# Patient Record
Sex: Female | Born: 1970 | Race: White | Hispanic: No | Marital: Married | Smoking: Never smoker
Health system: Southern US, Community
[De-identification: ages and names within clinical notes are randomized; demographics above are authoritative.]

## PROBLEM LIST (undated history)

## (undated) DIAGNOSIS — C801 Malignant (primary) neoplasm, unspecified: Secondary | ICD-10-CM

## (undated) DIAGNOSIS — G43909 Migraine, unspecified, not intractable, without status migrainosus: Secondary | ICD-10-CM

## (undated) DIAGNOSIS — F419 Anxiety disorder, unspecified: Secondary | ICD-10-CM

## (undated) DIAGNOSIS — C50919 Malignant neoplasm of unspecified site of unspecified female breast: Secondary | ICD-10-CM

## (undated) DIAGNOSIS — Z8709 Personal history of other diseases of the respiratory system: Secondary | ICD-10-CM

## (undated) DIAGNOSIS — K589 Irritable bowel syndrome without diarrhea: Secondary | ICD-10-CM

## (undated) DIAGNOSIS — K219 Gastro-esophageal reflux disease without esophagitis: Secondary | ICD-10-CM

## (undated) DIAGNOSIS — Z8489 Family history of other specified conditions: Secondary | ICD-10-CM

## (undated) DIAGNOSIS — Z923 Personal history of irradiation: Secondary | ICD-10-CM

## (undated) HISTORY — PX: TUBAL LIGATION: SHX77

## (undated) HISTORY — PX: ENDOMETRIAL ABLATION: SHX621

## (undated) HISTORY — PX: BREAST BIOPSY: SHX20

---

## 2004-11-04 ENCOUNTER — Ambulatory Visit: Payer: Self-pay | Admitting: Obstetrics and Gynecology

## 2007-09-20 ENCOUNTER — Ambulatory Visit: Payer: Self-pay | Admitting: Unknown Physician Specialty

## 2008-06-23 ENCOUNTER — Ambulatory Visit: Payer: Self-pay

## 2009-11-29 ENCOUNTER — Ambulatory Visit: Payer: Self-pay

## 2010-03-14 ENCOUNTER — Ambulatory Visit: Payer: Self-pay | Admitting: Surgery

## 2011-04-10 ENCOUNTER — Ambulatory Visit: Payer: Self-pay

## 2011-04-12 ENCOUNTER — Ambulatory Visit: Payer: Self-pay

## 2012-04-17 ENCOUNTER — Ambulatory Visit: Payer: Self-pay | Admitting: Unknown Physician Specialty

## 2012-05-13 ENCOUNTER — Ambulatory Visit: Payer: Self-pay

## 2012-07-17 DIAGNOSIS — C50919 Malignant neoplasm of unspecified site of unspecified female breast: Secondary | ICD-10-CM

## 2012-07-17 DIAGNOSIS — C801 Malignant (primary) neoplasm, unspecified: Secondary | ICD-10-CM

## 2012-07-17 HISTORY — PX: BREAST LUMPECTOMY: SHX2

## 2012-07-17 HISTORY — PX: BREAST BIOPSY: SHX20

## 2012-07-17 HISTORY — DX: Malignant neoplasm of unspecified site of unspecified female breast: C50.919

## 2012-07-17 HISTORY — DX: Malignant (primary) neoplasm, unspecified: C80.1

## 2013-03-21 ENCOUNTER — Ambulatory Visit: Payer: Self-pay | Admitting: Physical Medicine and Rehabilitation

## 2013-05-14 ENCOUNTER — Ambulatory Visit: Payer: Self-pay

## 2013-05-29 ENCOUNTER — Ambulatory Visit: Payer: Self-pay

## 2013-06-16 ENCOUNTER — Ambulatory Visit: Payer: Self-pay | Admitting: Surgery

## 2013-06-24 ENCOUNTER — Ambulatory Visit: Payer: Self-pay | Admitting: Surgery

## 2013-06-24 LAB — CBC
HCT: 43.9 % (ref 35.0–47.0)
MCH: 31 pg (ref 26.0–34.0)
MCHC: 34.5 g/dL (ref 32.0–36.0)
MCV: 90 fL (ref 80–100)
Platelet: 230 10*3/uL (ref 150–440)

## 2013-06-24 LAB — BASIC METABOLIC PANEL
Anion Gap: 2 — ABNORMAL LOW (ref 7–16)
BUN: 11 mg/dL (ref 7–18)
Co2: 26 mmol/L (ref 21–32)
Creatinine: 0.69 mg/dL (ref 0.60–1.30)
Osmolality: 269 (ref 275–301)
Potassium: 3.9 mmol/L (ref 3.5–5.1)
Sodium: 135 mmol/L — ABNORMAL LOW (ref 136–145)

## 2013-07-08 ENCOUNTER — Ambulatory Visit: Payer: Self-pay | Admitting: Oncology

## 2013-07-17 ENCOUNTER — Ambulatory Visit: Payer: Self-pay | Admitting: Oncology

## 2013-07-18 ENCOUNTER — Ambulatory Visit: Payer: Self-pay | Admitting: Oncology

## 2013-08-01 ENCOUNTER — Ambulatory Visit: Payer: Self-pay | Admitting: Unknown Physician Specialty

## 2013-08-07 LAB — CBC CANCER CENTER
Basophil #: 0.1 x10 3/mm (ref 0.0–0.1)
Basophil %: 1 %
EOS ABS: 0.2 x10 3/mm (ref 0.0–0.7)
EOS PCT: 2.5 %
HCT: 39.6 % (ref 35.0–47.0)
HGB: 13.3 g/dL (ref 12.0–16.0)
Lymphocyte #: 2.9 x10 3/mm (ref 1.0–3.6)
Lymphocyte %: 32.7 %
MCH: 30.4 pg (ref 26.0–34.0)
MCHC: 33.5 g/dL (ref 32.0–36.0)
MCV: 91 fL (ref 80–100)
MONOS PCT: 8.6 %
Monocyte #: 0.8 x10 3/mm (ref 0.2–0.9)
Neutrophil #: 5 x10 3/mm (ref 1.4–6.5)
Neutrophil %: 55.2 %
PLATELETS: 264 x10 3/mm (ref 150–440)
RBC: 4.38 10*6/uL (ref 3.80–5.20)
RDW: 12.9 % (ref 11.5–14.5)
WBC: 9 x10 3/mm (ref 3.6–11.0)

## 2013-08-07 LAB — PATHOLOGY REPORT

## 2013-08-17 ENCOUNTER — Ambulatory Visit: Payer: Self-pay | Admitting: Oncology

## 2013-08-21 LAB — CBC CANCER CENTER
BASOS PCT: 0.7 %
Basophil #: 0.1 x10 3/mm (ref 0.0–0.1)
EOS ABS: 0.2 x10 3/mm (ref 0.0–0.7)
Eosinophil %: 2.1 %
HCT: 41.3 % (ref 35.0–47.0)
HGB: 13.9 g/dL (ref 12.0–16.0)
LYMPHS PCT: 27.4 %
Lymphocyte #: 2.6 x10 3/mm (ref 1.0–3.6)
MCH: 30.3 pg (ref 26.0–34.0)
MCHC: 33.6 g/dL (ref 32.0–36.0)
MCV: 90 fL (ref 80–100)
Monocyte #: 0.9 x10 3/mm (ref 0.2–0.9)
Monocyte %: 9.5 %
NEUTROS ABS: 5.8 x10 3/mm (ref 1.4–6.5)
NEUTROS PCT: 60.3 %
PLATELETS: 240 x10 3/mm (ref 150–440)
RBC: 4.58 10*6/uL (ref 3.80–5.20)
RDW: 13.2 % (ref 11.5–14.5)
WBC: 9.5 x10 3/mm (ref 3.6–11.0)

## 2013-08-28 LAB — CBC CANCER CENTER
BASOS ABS: 0.1 x10 3/mm (ref 0.0–0.1)
Basophil %: 0.9 %
Eosinophil #: 0.3 x10 3/mm (ref 0.0–0.7)
Eosinophil %: 3.3 %
HCT: 37.6 % (ref 35.0–47.0)
HGB: 12.8 g/dL (ref 12.0–16.0)
Lymphocyte #: 2.4 x10 3/mm (ref 1.0–3.6)
Lymphocyte %: 29.6 %
MCH: 30.3 pg (ref 26.0–34.0)
MCHC: 34.1 g/dL (ref 32.0–36.0)
MCV: 89 fL (ref 80–100)
MONO ABS: 0.9 x10 3/mm (ref 0.2–0.9)
MONOS PCT: 10.8 %
NEUTROS ABS: 4.5 x10 3/mm (ref 1.4–6.5)
Neutrophil %: 55.4 %
PLATELETS: 221 x10 3/mm (ref 150–440)
RBC: 4.24 10*6/uL (ref 3.80–5.20)
RDW: 12.8 % (ref 11.5–14.5)
WBC: 8.1 x10 3/mm (ref 3.6–11.0)

## 2013-09-04 LAB — CBC CANCER CENTER
BASOS ABS: 0.1 x10 3/mm (ref 0.0–0.1)
BASOS PCT: 0.8 %
Eosinophil #: 0.1 x10 3/mm (ref 0.0–0.7)
Eosinophil %: 1.5 %
HCT: 40.5 % (ref 35.0–47.0)
HGB: 13.4 g/dL (ref 12.0–16.0)
Lymphocyte #: 1.8 x10 3/mm (ref 1.0–3.6)
Lymphocyte %: 22.3 %
MCH: 30.3 pg (ref 26.0–34.0)
MCHC: 33.2 g/dL (ref 32.0–36.0)
MCV: 91 fL (ref 80–100)
MONO ABS: 0.7 x10 3/mm (ref 0.2–0.9)
Monocyte %: 9 %
NEUTROS ABS: 5.4 x10 3/mm (ref 1.4–6.5)
NEUTROS PCT: 66.4 %
Platelet: 256 x10 3/mm (ref 150–440)
RBC: 4.43 10*6/uL (ref 3.80–5.20)
RDW: 12.5 % (ref 11.5–14.5)
WBC: 8.1 x10 3/mm (ref 3.6–11.0)

## 2013-09-14 ENCOUNTER — Ambulatory Visit: Payer: Self-pay | Admitting: Oncology

## 2013-10-15 ENCOUNTER — Ambulatory Visit: Payer: Self-pay | Admitting: Oncology

## 2013-11-27 ENCOUNTER — Ambulatory Visit: Payer: Self-pay | Admitting: Surgery

## 2014-01-09 ENCOUNTER — Ambulatory Visit: Payer: Self-pay | Admitting: Oncology

## 2014-01-14 ENCOUNTER — Ambulatory Visit: Payer: Self-pay | Admitting: Oncology

## 2014-04-01 ENCOUNTER — Ambulatory Visit: Payer: Self-pay | Admitting: Radiation Oncology

## 2014-04-16 ENCOUNTER — Ambulatory Visit: Payer: Self-pay | Admitting: Radiation Oncology

## 2014-05-09 ENCOUNTER — Ambulatory Visit: Payer: Self-pay | Admitting: Internal Medicine

## 2014-05-18 ENCOUNTER — Ambulatory Visit: Payer: Self-pay

## 2014-07-30 ENCOUNTER — Ambulatory Visit: Payer: Self-pay | Admitting: Oncology

## 2014-07-30 LAB — CBC CANCER CENTER
Basophil #: 0.1 x10 3/mm (ref 0.0–0.1)
Basophil %: 0.7 %
EOS ABS: 0.3 x10 3/mm (ref 0.0–0.7)
Eosinophil %: 2.9 %
HCT: 38.4 % (ref 35.0–47.0)
HGB: 12.8 g/dL (ref 12.0–16.0)
LYMPHS ABS: 3.7 x10 3/mm — AB (ref 1.0–3.6)
Lymphocyte %: 33 %
MCH: 30.3 pg (ref 26.0–34.0)
MCHC: 33.5 g/dL (ref 32.0–36.0)
MCV: 91 fL (ref 80–100)
MONO ABS: 1.1 x10 3/mm — AB (ref 0.2–0.9)
Monocyte %: 9.8 %
Neutrophil #: 6 x10 3/mm (ref 1.4–6.5)
Neutrophil %: 53.6 %
Platelet: 241 x10 3/mm (ref 150–440)
RBC: 4.23 10*6/uL (ref 3.80–5.20)
RDW: 13.4 % (ref 11.5–14.5)
WBC: 11.2 x10 3/mm — ABNORMAL HIGH (ref 3.6–11.0)

## 2014-07-30 LAB — BASIC METABOLIC PANEL
Anion Gap: 12 (ref 7–16)
BUN: 10 mg/dL (ref 7–18)
CALCIUM: 8.6 mg/dL (ref 8.5–10.1)
CO2: 26 mmol/L (ref 21–32)
Chloride: 105 mmol/L (ref 98–107)
Creatinine: 0.9 mg/dL (ref 0.60–1.30)
EGFR (African American): >60
GLUCOSE: 109 mg/dL — AB (ref 65–99)
OSMOLALITY: 285 (ref 275–301)
POTASSIUM: 3.5 mmol/L (ref 3.5–5.1)
Sodium: 143 mmol/L (ref 136–145)

## 2014-07-31 LAB — CANCER ANTIGEN 27.29: CA 27.29: 19.2 U/mL (ref 0.0–38.6)

## 2014-08-17 ENCOUNTER — Ambulatory Visit: Payer: Self-pay | Admitting: Oncology

## 2014-10-01 ENCOUNTER — Ambulatory Visit
Admit: 2014-10-01 | Disposition: A | Discharge status: Home / Self Care | Payer: Self-pay | Attending: Oncology | Admitting: Oncology

## 2014-11-06 NOTE — Op Note (Signed)
PATIENT NAME:  Carla Hunt, Carla Hunt MR#:  [id number] DATE OF BIRTH:  [birth year]  DATE OF PROCEDURE:  06/24/2013  PREOPERATIVE DIAGNOSIS: Ductal carcinoma in situ of the right breast.   POSTOPERATIVE DIAGNOSIS: Ductal carcinoma in situ of the right breast.   PROCEDURE: Right partial mastectomy.   SURGEON: Rochel Brome, MD  ANESTHESIA: General.   INDICATIONS: This 44 year old female recently had a mammogram depicting a cluster of microcalcifications, upper inner quadrant of the right breast, which was superficial. She had core needle biopsy demonstrating ductal carcinoma in situ. This appeared to expand in an area of about 11 mm. Surgery was recommended for definitive treatment. She had preoperative x-ray needle localization. Mammogram images were reviewed The lesion was somewhat superficial and found in the upper inner quadrant of the right breast just outside the border of the areola.   DESCRIPTION OF PROCEDURE: The patient was placed on the operating table in the supine position under general anesthesia. The dressing was removed from the left breast exposing the Kopans wire, which entered the breast in the upper inner quadrant just about 12 mm outside the border of the areola. The wire was cut 2 cm from the skin. The breast was prepared with ChloraPrep and draped in a sterile manner.   A curvilinear incision was made from the 12 o'clock position to the 3:30 position and also removed an ellipse of skin which was approximately 8 mm in width, and this dissection was carried down through subcutaneous tissues around the wire and dissected down within the breast removing a portion of tissue, with the skin attached, which was approximately 3 cm in dimension. It is noted that as a specimen was excised there was an opening in the biopsy cavity laterally located. The specimen was marked so that the 3:30 end of the skin ellipse was tagged with a stitch and also margin maps were attached with sutures to the  specimen marking the cranial, caudal, medial, lateral, and deep margins.   It is further noted that the medial margin was re-excised due to the opening in the biopsy cavity. This re-excision was approximately 2.5 x 2.5 x 7 mm in dimension and was oriented so that the new margin was placed on Telfa and this was sutured to the Telfa.   The wound was inspected and 1 clamped vessel was suture ligated with 4-0 chromic. Several small bleeding points were cauterized. The wound was infiltrated with 0.5% Sensorcaine with epinephrine. Deeper tissues were approximated with 4-0 chromic and subcutaneous tissues were approximated with 4-0 chromic. The skin was closed with running 4-0 Monocryl subcuticular suture.   The radiologist called to indicate that the biopsy marker was seen within the specimen mammogram. The pathologist later called back to report that the biopsy cavity was identified and appeared that margins were satisfactory. Specimens have been submitted for routine pathology.   The wound was dressed with Dermabond. The patient tolerated the procedure satisfactorily and was then prepared for transfer to the recovery room.  ____________________________ Lenna Sciara. Rochel Brome, MD jws:sb D: 06/24/2013 11:04:37 ET T: 06/24/2013 11:14:54 ET JOB#: [id number]  cc: Loreli Dollar, MD, <Dictator> Loreli Dollar MD ELECTRONICALLY SIGNED 06/24/2013 19:30

## 2014-11-07 NOTE — Consult Note (Signed)
Reason for Visit: This 44 year old Female patient presents to the clinic for initial evaluation of  breast cancer .   Referred by Dr. Tamala Julian.  Diagnosis:  Chief Complaint/Diagnosis   44 year old female presented with abnormal mammogram by 2 positive ER/PR positive ductal carcinoma in situ status post wide local excision now for adjuvant radiation therapy as well as tamoxifen treatment.stage 0 (Tis N0 M0)  Pathology Report pathology report reviewed   Imaging Report mammograms reviewed   Referral Report clinical notes reviewed   Planned Treatment Regimen whole breast radiation plus tamoxifen   HPI   patient is a 44 year old female who presented withabnormal mammogram of the right breast showing mildly suspicious calcifications anteriorly in the 1:00 position. Stereotactic core biopsy was recommended and she underwent core biopsy by Dr. Tamala Julian showing ER/PR positive ductal carcinoma in situ with focal high-grade DCIS.she wants have a wide local excision with to focuses of these tests show measuring 1.1 and 1.5 cm. Tumor was overall grade 2 margins were clear but close at 0.2 mm. She's done well postoperatively. Has seen medical oncology was recommended tamoxifen therapy after completion of radiation. She seen today for discussion of radiation therapy options. She is doing well. She is without complaint. She specifically denies breast tenderness cough or bone pain.  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   Family History positive   Family History Comments for paternal aunts  with breast cancer also maternal history of colon cancer lymphoma and ovarian cancer also family history of anemia adult onset diabetes hypertension and CVA   Social History positive   Social History Comments no smoking history occasional EtOH use history   Additional Past Medical and Surgical History seen by herself today   Allergies:   No Known Allergies:   Home Meds:  Home Medications: Medication Instructions Status  Lexapro 20 mg oral tablet 1 tab(s) orally once a day - medication Active  KlonoPIN 0.5 mg oral tablet 1  orally 2 times a day, As Needed - for Pain - for Anxiety, Nervousness, Active  omeprazole 20 mg oral delayed release capsule 1 cap(s) orally once a day Active  ibuprofen 800 mg oral tablet 1 tab(s) orally 3 times a day, As Needed - for Headache Active  cyclobenzaprine 10 mg oral tablet  orally 2 times a day, As Needed - for Pain Active  etodolac 500 mg oral tablet 1 tab(s) orally 2 times a day, As Needed - for Pain Active  gabapentin 300 mg oral capsule 1 cap(s) orally 3 times a day, As Needed cervical degenerative disc Active   Review of Systems:  General negative   Performance Status (ECOG) 0   Skin negative   Breast negative   Ophthalmologic negative   ENMT negative   Respiratory and Thorax negative   Cardiovascular negative   Gastrointestinal negative   Genitourinary negative   Musculoskeletal negative   Neurological negative   Psychiatric negative   Hematology/Lymphatics negative   Endocrine negative   Allergic/Immunologic negative   Review of  Systems   review of systems obtained from nurse's notes  Nursing Notes:  Nursing Vital Signs and Chemo Nursing Nursing Notes: *CC Vital Signs Flowsheet:   02-Jan-15 09:17  Temp Temperature 97.4  Pulse Pulse 69  Respirations Respirations 20  SBP SBP 126  DBP DBP 79  Pain Scale (0-10)  0  Pulse Oxi  79  Current Weight (kg) (kg) 79.5  Height (cm) centimeters 158  BSA (m2) 1.8    Physical Exam:  General/Skin/HEENT:  General normal   Skin normal   Eyes normal   ENMT normal   Head and Neck normal   Additional PE well-developed well-nourished female in NAD. Lungs are clear to A&P cardiac examination shows regular rate and rhythm. Breasts are somewhat pendulous no dominant mass or nodularity is noted in either breast into position examined. She has a circular incision around the nipple areolar complex which is well-healed. No axillary or supraclavicular adenopathy is appreciated.   Breasts/Resp/CV/GI/GU:  Respiratory and Thorax normal   Cardiovascular normal   Gastrointestinal normal   Genitourinary normal   MS/Neuro/Psych/Lymph:  Musculoskeletal normal   Neurological normal   Lymphatics normal   Other Results:  Radiology Results: LabUnknown:    29-Oct-14 12:06, Screening Digital Mammogram  PACS Image     13-Nov-14 15:19, Digital Additional Views Rt Breast (SCR)  PACS Image   [facility]:  Digital Additional Views Rt Breast (SCR)   REASON FOR EXAM:    av rt calcs and asymmetry  COMMENTS:       PROCEDURE: MAM - MAM DIG ADDVIEWS RT SCR  - May 29 2013  3:19PM     CLINICAL DATA:  Further evaluation of right calcifications and  possible right mass in patient with history of subareolar papilloma  on the right    EXAM:  DIGITAL DIAGNOSTIC  RIGHT MAMMOGRAM    ULTRASOUND RIGHT BREAST    COMPARISON:  05/13/2012, 04/10/2011, 11/29/2009, 06/23/2008, and  05/14/2013    ACR Breast Density Category b: There are scattered areas of  fibroglandular density.    FINDINGS:  On magnified views, there are subareolar clustered punctate  calcifications in the upper inner quadrant, over an area of 11 mm.  This represents a change from the prior study. For the anterior  aspect of this cluster, there are more focally clustered punctate  calcifications.    Posteriorly in the upper inner quadrant, the area of asymmetry  becomes less conspicuous on  spot compression views without evidence  of mass or architectural distortion, but there is a changein this  area when compared to prior studies. Therefore, it is evaluated with  ultrasound.    On physical exam there are no palpable abnormalities.    Ultrasound is performed, showing no suspicious findings in the upper  inner quadrant of the right breast. In the 1 o'clock position, there  is a hypoechoic oval circumscribed mass 7 cm from the nipple. It  measures 10 x 3 x 7 mm and it appears most consistent with a cluster  of cysts.     IMPRESSION:  1. Mildly suspicious calcifications anteriorly in the right breast.  Given the patient's history, possibilities include benign  fibrocystic change, papilloma, or neoplasia.  2. Focus of apocrine metaplasia air or fibrocystic change  posteriorly in the upper inner quadrant.    RECOMMENDATION:  Recommend stereotactic core needle biopsy for calcifications.  Recommend 6 month followup diagnostic mammogram and ultrasound for  asymmetry.The patient's referring provider was contacted and  indicated that the patient  will be referred for surgical  consultation.    I have discussed the findings and recommendations with the patient.  Results were also provided in writing at the conclusion of the  visit. If applicable, a reminder letter will be sent to the patient  regarding the next appointment.  BI-RADS CATEGORY  4: Suspicious abnormality - biopsy should be  considered.      Electronically Signed    By: Skipper Cliche M.D.    On: 05/29/2013 16:22         Verified By: Rachael Fee, M.D.,   Relevent Results:   Relevant Scans and Labs mammograms reviewed   Assessment and Plan: Impression:   ductal carcinoma in situ ER/PR positive status post wide local excision in 44 year old female now for adjuvant radiation therapy plus tamoxifen treatment Plan:   at the present time I recommended postoperative radiation therapy. She is not  candidate for accelerated partial breast radiation based on her young age and DCISnature of her lesion. Would recommend whole breast radiation. Also based on her breast size she would not be a good candidate for hyperfractionated treatment. Would plan on delivering 5000 cGy over 5 weeks and boosting or scar another 1600 cGy based on the close margin. Risks and benefits of treatment including skin reaction, fatigue, inclusive some foot superficial lung, and possible alteration of blood counts all were explained in detail to the patient. She seems to comprehend my treatment plan well. I have set her up for treatment planning CT simulation next week. Patient also be a candidate for tamoxifen therapy after completion of radiation.  I would like to take this opportunity to thank you for allowing me to continue to participate in this patient's care.  CC Referral:  cc: Dr. Tamala Julian, Dr. Frazier Richards   Electronic Signatures: Baruch Gouty, Roda Shutters (MD)  (Signed 02-Jan-15 09:50)  Authored: HPI, Diagnosis, Past Hx, PFSH, Allergies, Home Meds, ROS, Nursing Notes, Physical Exam, Other Results, Relevent Results, Encounter Assessment and Plan, CC Referring Physician   Last Updated: 02-Jan-15 09:50 by Armstead Peaks (MD)

## 2015-01-04 ENCOUNTER — Telehealth: Payer: Self-pay | Admitting: *Deleted

## 2015-01-04 DIAGNOSIS — D0510 Intraductal carcinoma in situ of unspecified breast: Secondary | ICD-10-CM

## 2015-01-04 MED ORDER — ESCITALOPRAM OXALATE 20 MG PO TABS: 20.0000 mg | ORAL_TABLET | Freq: Every day | ORAL | Status: DC

## 2015-01-04 NOTE — Telephone Encounter (Signed)
ecsribed

## 2015-01-28 ENCOUNTER — Inpatient Hospital Stay: Payer: BLUE CROSS/BLUE SHIELD | Attending: Oncology | Admitting: Oncology

## 2015-01-28 VITALS — BP 106/73 | HR 85 | Temp 96.6°F | Resp 18 | Wt 184.5 lb

## 2015-01-28 DIAGNOSIS — Z170 Estrogen receptor positive status [ER+]: Secondary | ICD-10-CM | POA: Diagnosis not present

## 2015-01-28 DIAGNOSIS — Z79811 Long term (current) use of aromatase inhibitors: Secondary | ICD-10-CM | POA: Diagnosis not present

## 2015-01-28 DIAGNOSIS — Z79899 Other long term (current) drug therapy: Secondary | ICD-10-CM | POA: Diagnosis not present

## 2015-01-28 DIAGNOSIS — D0511 Intraductal carcinoma in situ of right breast: Secondary | ICD-10-CM | POA: Diagnosis present

## 2015-01-28 NOTE — Progress Notes (Signed)
Patient is here for 6 month follow-up. She states that overall she has been doing well and offers no complaints. Last mammogram was in November 2015.

## 2015-01-29 MED ORDER — CLONAZEPAM 0.5 MG PO TABS: 0.5000 mg | ORAL_TABLET | Freq: Two times a day (BID) | ORAL | Status: DC | PRN

## 2015-02-04 ENCOUNTER — Other Ambulatory Visit: Payer: Self-pay | Admitting: *Deleted

## 2015-02-04 DIAGNOSIS — D0510 Intraductal carcinoma in situ of unspecified breast: Secondary | ICD-10-CM

## 2015-02-04 MED ORDER — ESCITALOPRAM OXALATE 20 MG PO TABS: 20.0000 mg | ORAL_TABLET | Freq: Every day | ORAL | Status: DC

## 2015-02-13 DIAGNOSIS — D0511 Intraductal carcinoma in situ of right breast: Secondary | ICD-10-CM | POA: Insufficient documentation

## 2015-02-13 NOTE — Progress Notes (Signed)
[place]  [phone] [phone]) [phone]  ID: Carla Hunt OB: March 12, 1971  MR#: [id number]  URK#:[id number]  Patient Care Team: Idelle Crouch, MD as PCP - General (Internal Medicine)  CHIEF COMPLAINT:  Chief Complaint  Patient presents with  . Follow-up    DCIS    INTERVAL HISTORY: Patient returns to clinic for routine 6 month evaluation. She continues to tolerate Tamoxifen without significant side effects. She has some mild hot flashes intermittently.  She currently feels well and is asymptomatic.  She has no neurologic complaints.  She denies any fevers.  She has a good appetite and denies weight loss.  She has no chest pain or shortness of breath.  She denies any nausea, vomiting, constipation, or diarrhea.  She has no urinary complaints.  Patient offers no futher specific complaints today.  REVIEW OF SYSTEMS:   Review of Systems  Constitutional: Negative.     As per HPI. Otherwise, a complete review of systems is negatve.  PAST MEDICAL HISTORY: IBS, migraines  PAST SURGICAL HISTORY: endometrial ablation, tonsillectomy, bilateral tubal ligation  FAMILY HISTORY: 4 paternal aunts with breast cancer, unclear ages or stage.  Patient also has family members on her maternal side with colon, lymphoma, and ovarian cancer.  Also, anemia, diabetes, hypertension, CVA     ADVANCED DIRECTIVES:    HEALTH MAINTENANCE: History  Substance Use Topics  . Smoking status: Not on file  . Smokeless tobacco: Not on file  . Alcohol Use: Not on file     Colonoscopy:  PAP:  Bone density:  Lipid panel:  No Known Allergies  Current Outpatient Prescriptions  Medication Sig Dispense Refill  . albuterol (PROAIR HFA) 108 (90 BASE) MCG/ACT inhaler     . clonazePAM (KLONOPIN) 0.5 MG tablet Take 1 tablet (0.5 mg total) by mouth 2 (two) times daily as needed for anxiety. 45 tablet 0  . HYDROcodone-homatropine (HYCODAN) 5-1.5 MG/5ML syrup     . hyoscyamine  (LEVSIN, ANASPAZ) 0.125 MG tablet Take by mouth.    Marland Kitchen ibuprofen (ADVIL,MOTRIN) 800 MG tablet Take by mouth.    . tamoxifen (NOLVADEX) 20 MG tablet     . topiramate (TOPAMAX) 50 MG tablet Take by mouth.    . escitalopram (LEXAPRO) 20 MG tablet Take 1 tablet (20 mg total) by mouth daily. 30 tablet 0   No current facility-administered medications for this visit.    OBJECTIVE: Filed Vitals:   01/28/15 1510  BP: 106/73  Pulse: 85  Temp: 96.6 F (35.9 C)  Resp: 18     There is no height on file to calculate BMI.    ECOG FS:0 - Asymptomatic  General: Well-developed, well-nourished, no acute distress. Eyes: Pink conjunctiva, anicteric sclera. Bilateral breast and axilla without lumps or masses. Lungs: Clear to auscultation bilaterally. Heart: Regular rate and rhythm. No rubs, murmurs, or gallops. Abdomen: Soft, nontender, nondistended. No organomegaly noted, normoactive bowel sounds. Musculoskeletal: No edema, cyanosis, or clubbing. Neuro: Alert, answering all questions appropriately. Cranial nerves grossly intact. Skin: No rashes or petechiae noted. Psych: Normal affect.   LAB RESULTS:  Lab Results  Component Value Date   NA 143 07/30/2014   K 3.5 07/30/2014   CL 105 07/30/2014   CO2 26 07/30/2014   GLUCOSE 109* 07/30/2014   BUN 10 07/30/2014   CREATININE 0.90 07/30/2014   CALCIUM 8.6 07/30/2014   GFRNONAA >60 06/24/2013   GFRAA >60 06/24/2013    Lab Results  Component Value Date   WBC 11.2* 07/30/2014  NEUTROABS 6.0 07/30/2014   HGB 12.8 07/30/2014   HCT 38.4 07/30/2014   MCV 91 07/30/2014   PLT 241 07/30/2014     STUDIES: No results found.  ASSESSMENT: DCIS  PLAN:    1.  DCIS: Since patient did not have an invasive component, adjuvant chemotherapy was not necessary.  Continue tamoxifen for total 5 years completing in March 2020. Her most recent mammogram on May 18, 2014 was reported as BI-RADS 2, repeat in November 2016. Return to clinic in 6 months  for routine evaluation. 2. Family history: Consider genetic testing in the future. Will discuss her next clinic visit.  Patient expressed understanding and was in agreement with this plan. She also understands that She can call clinic at any time with any questions, concerns, or complaints.   DCIS (ductal carcinoma in situ)   Staging form: Breast, AJCC 7th Edition     Clinical stage from 02/13/2015: Stage 0 (Tis (DCIS), N0, M0) - Signed by Lloyd Huger, MD on 02/13/2015   Lloyd Huger, MD   02/13/2015 3:04 PM

## 2015-04-09 ENCOUNTER — Other Ambulatory Visit: Payer: Self-pay | Admitting: *Deleted

## 2015-04-09 DIAGNOSIS — D0510 Intraductal carcinoma in situ of unspecified breast: Secondary | ICD-10-CM

## 2015-04-09 MED ORDER — ESCITALOPRAM OXALATE 20 MG PO TABS: 20.0000 mg | ORAL_TABLET | Freq: Every day | ORAL | Status: DC

## 2015-05-18 ENCOUNTER — Other Ambulatory Visit: Payer: Self-pay | Admitting: *Deleted

## 2015-05-18 DIAGNOSIS — D0510 Intraductal carcinoma in situ of unspecified breast: Secondary | ICD-10-CM

## 2015-05-18 MED ORDER — ESCITALOPRAM OXALATE 20 MG PO TABS: 20.0000 mg | ORAL_TABLET | Freq: Every day | ORAL | Status: DC

## 2015-05-20 ENCOUNTER — Ambulatory Visit
Admission: RE | Admit: 2015-05-20 | Discharge: 2015-05-20 | Disposition: A | Discharge status: Home / Self Care | Payer: BLUE CROSS/BLUE SHIELD | Source: Ambulatory Visit | Attending: Oncology | Admitting: Oncology

## 2015-05-20 ENCOUNTER — Other Ambulatory Visit: Payer: Self-pay | Admitting: Oncology

## 2015-05-20 DIAGNOSIS — D0511 Intraductal carcinoma in situ of right breast: Secondary | ICD-10-CM | POA: Insufficient documentation

## 2015-05-20 DIAGNOSIS — Z923 Personal history of irradiation: Secondary | ICD-10-CM | POA: Insufficient documentation

## 2015-05-20 DIAGNOSIS — Z9889 Other specified postprocedural states: Secondary | ICD-10-CM | POA: Diagnosis not present

## 2015-05-20 HISTORY — DX: Malignant (primary) neoplasm, unspecified: C80.1

## 2015-05-20 HISTORY — DX: Malignant neoplasm of unspecified site of unspecified female breast: C50.919

## 2015-06-15 NOTE — H&P (Signed)
Patient ID: Carla Hunt is a 44 y.o. female presenting with Pre Op Consulting  on 05/24/2015  HPI: Pelvic pain x several months, back pain triggered by right lower abdominal pain. Hx of endometrial ablation with bilateral tubal ligation. Is not having cycles, but notices the pain more intensely at certain times of the month - it is not exacerbated by food, bowel movement. Pt does have hx of IBS with constipation. Hx of estrogen receptor positive breast cancer on tamoxifen until 2020. Hx of "redundant colon". Hx of persistent HPV x26yrs. Prior abdominal surgery: BTL   Uterus measuring 4x5x3cm, no adnexal masses.  Past Medical History:  has a past medical history of Allergic rhinitis; Cancer (06/2013); Degenerative disc disease; migraines; Irritable bowel syndrome; Obesity; and Redundant colon.  Past Surgical History:  has a past surgical history that includes right breast duct removal; Endometrial ablation; Tubal ligation (2006); and Tonsillectomy (1979). Family History: family history includes Breast cancer in her paternal aunt and sister; COPD in her father; Hypertension in her father, maternal grandfather, and mother; Stroke in her sister. Social History:  reports that she has never smoked. She has never used smokeless tobacco. She reports that she drinks alcohol. She reports that she does not use illicit drugs. OB/GYN History:  OB History    Gravida Para Term Preterm AB TAB SAB Ectopic Multiple Living   2 2 2       2       Allergies: has No Known Allergies. Medications:  Current Outpatient Prescriptions:  . ciprofloxacin HCl (CIPRO) 250 MG tablet, Take 1 tablet (250 mg total) by mouth 2 (two) times daily for 3 days., Disp: 6 tablet, Rfl: 0 . clonazePAM (KLONOPIN) 0.5 MG tablet, Take 0.5 mg by mouth 2 (two) times daily as needed for Anxiety., Disp: , Rfl:  . escitalopram oxalate (LEXAPRO) 10 MG tablet, Take 10 mg by mouth once daily., Disp: , Rfl:  .  hydrocodone-homatropine (HYCODAN) 5-1.5 mg/5 mL syrup, , Disp: , Rfl: 0 . hyoscyamine (ANASPAZ,LEVSIN) 0.125 mg tablet, Take 0.125 mg by mouth every 4 (four) hours as needed for Cramping., Disp: , Rfl:  . ibuprofen (ADVIL,MOTRIN) 800 MG tablet, Take 800 mg by mouth every 6 (six) hours as needed for Pain., Disp: , Rfl:  . PROAIR HFA 90 mcg/actuation inhaler, , Disp: , Rfl: 0 . tamoxifen (NOLVADEX) 10 MG tablet, Take 10 mg by mouth., Disp: , Rfl:  . topiramate (TOPAMAX) 50 MG tablet, Take 1 tablet (50 mg total) by mouth 2 (two) times daily., Disp: 180 tablet, Rfl: 3  Review of Systems  Constitutional: Negative. Negative for fatigue and fever.  HENT: Negative.  Respiratory: Negative. Negative for shortness of breath.  Cardiovascular: Negative for chest pain and palpitations.  Gastrointestinal: Negative for abdominal pain, constipation and diarrhea.  Endocrine: Negative for cold intolerance.  Genitourinary: Positive for pelvic pain. Negative for difficulty urinating, dyspareunia, dysuria, frequency, hematuria, menstrual problem, urgency, vaginal bleeding, vaginal discharge and vaginal pain.  + Pelvic pain  Neurological: Negative for dizziness and light-headedness.  Psychiatric/Behavioral:  No changes in mood    Exam:         Visit Vitals  . BP 124/85  . Pulse 112  . Wt 85.8 kg (189 lb 3.2 oz)  . BMI 33.52 kg/m2   Physical Exam  Constitutional: She is oriented to person, place, and time. She appears well-developed and well-nourished. No distress.  Eyes: No scleral icterus.  Neck: Normal range of motion. Neck supple. No tracheal deviation present. No  thyromegaly present.  Cardiovascular: Normal rate, regular rhythm and normal heart sounds.  No murmur heard. Pulmonary/Chest: Effort normal and breath sounds normal. She has no wheezes. She has no rales.  Abdominal: She exhibits no distension and no mass. There is no tenderness. There is no rebound and no guarding.  Genitourinary:   Genitourinary Comments:  Pelvic exam: External: Tanner stage 5, normal female genitalia without lesions or masses Bladder: Normal size without masses or tenderness, well-supported Urethra: No lesions or discharge with palpation. Normal urethral size and location, no prolapse Vagina: normal physiological discharge, without lesions or masses Cervix: normal without lesions or masses Adnexa: normal bimanual exam without masses or fullness Uterus: Normal size and position without masses or tenderness.  Anus/Perineum: Normal external exam  Musculoskeletal: Normal range of motion.  Lymphadenopathy:  She has no cervical adenopathy.  Neurological: She is alert and oriented to person, place, and time.  Skin: Skin is warm and dry.  Psychiatric: She has a normal mood and affect.   Small introitus, no uterine descent.        Impression:   The primary encounter diagnosis was Pelvic pain in female. Diagnoses of Cervical high risk HPV (human papillomavirus) test positive and Primary breast cancer were also pertinent to this visit.    Plan:   Patient returns for a preoperative discussion regarding her plans to proceed with definitive surgical treatment of her pelvic pain, resumed post tubal ablation syndrome by robotically assisted total vaginal hysterectomy with bilateral salpingoophorectomy because of her estrogen receptor positive breast cancer. I am choosing a robotic intervention because of her hx of redundant colon, BMI of 33.5 and prior abdominal surgery. She is aware that this will place in her surgical menopause, but as she is having hot flashes on the tamoxifen and is already on an SSRI (Lexapro 20) she is interested in just having ovaries removed.   The patient and I discussed the technical aspects of the procedure including the potential for risks and complications. These include but are not limited to the risk of infection requiring post-operative antibiotics or further  procedures. We talked about the risk of injury to adjacent organs including bladder, bowel, ureter, blood vessels or nerves. We talked about the need to convert to an open incision. We talked about the possible need for blood transfusion. We talked aboutpostop complications such asthromboembolic or cardiopulmonary complications. She is aware that if bowel issues are a major component of her pain, it may not be resolved by gyn surgery alone. She does have f/u with GI and I have encouraged her to go.  All of her questions were answered. Her preoperative exam was completed and the appropriate consents were signed. She is scheduled to undergo this procedure in the near future.

## 2015-06-17 ENCOUNTER — Encounter
Admission: RE | Admit: 2015-06-17 | Discharge: 2015-06-17 | Disposition: A | Discharge status: Home / Self Care | Payer: BLUE CROSS/BLUE SHIELD | Source: Ambulatory Visit | Attending: Obstetrics and Gynecology | Admitting: Obstetrics and Gynecology

## 2015-06-17 DIAGNOSIS — Z01812 Encounter for preprocedural laboratory examination: Secondary | ICD-10-CM | POA: Insufficient documentation

## 2015-06-17 HISTORY — DX: Migraine, unspecified, not intractable, without status migrainosus: G43.909

## 2015-06-17 HISTORY — DX: Irritable bowel syndrome, unspecified: K58.9

## 2015-06-17 LAB — BASIC METABOLIC PANEL
ANION GAP: 9 (ref 5–15)
BUN: 10 mg/dL (ref 6–20)
CHLORIDE: 105 mmol/L (ref 101–111)
CO2: 27 mmol/L (ref 22–32)
Calcium: 9.5 mg/dL (ref 8.9–10.3)
Creatinine, Ser: 0.67 mg/dL (ref 0.44–1.00)
GFR calc non Af Amer: >60 mL/min (ref >60)
Glucose, Bld: 87 mg/dL (ref 65–99)
POTASSIUM: 4 mmol/L (ref 3.5–5.1)
Sodium: 141 mmol/L (ref 135–145)

## 2015-06-17 LAB — CBC
HCT: 40 % (ref 35.0–47.0)
HEMOGLOBIN: 13.2 g/dL (ref 12.0–16.0)
MCH: 30.1 pg (ref 26.0–34.0)
MCHC: 32.9 g/dL (ref 32.0–36.0)
MCV: 91.4 fL (ref 80.0–100.0)
Platelets: 219 10*3/uL (ref 150–440)
RBC: 4.38 MIL/uL (ref 3.80–5.20)
RDW: 12.9 % (ref 11.5–14.5)
WBC: 8.2 10*3/uL (ref 3.6–11.0)

## 2015-06-17 LAB — TYPE AND SCREEN
ABO/RH(D): A POS
Antibody Screen: NEGATIVE

## 2015-06-17 LAB — ABO/RH: ABO/RH(D): A POS

## 2015-06-17 NOTE — Patient Instructions (Signed)
  Your procedure is scheduled on:06/25/15 Report to Day Surgery. To find out your arrival time please call [phone] between 1PM - 3PM on 06/24/15.  Remember: Instructions that are not followed completely may result in serious medical risk, up to and including death, or upon the discretion of your surgeon and anesthesiologist your surgery may need to be rescheduled.    __x__ 1. Do not eat food or drink liquids after midnight. No gum chewing or hard candies.     __x__ 2. No Alcohol for 24 hours before or after surgery.   ____ 3. Bring all medications with you on the day of surgery if instructed.    __x__ 4. Notify your doctor if there is any change in your medical condition     (cold, fever, infections).     Do not wear jewelry, make-up, hairpins, clips or nail polish.  Do not wear lotions, powders, or perfumes. You may wear deodorant.  Do not shave 48 hours prior to surgery. Men may shave face and neck.  Do not bring valuables to the hospital.    [facility] is not responsible for any belongings or valuables.               Contacts, dentures or bridgework may not be worn into surgery.  Leave your suitcase in the car. After surgery it may be brought to your room.  For patients admitted to the hospital, discharge time is determined by your                treatment team.   Patients discharged the day of surgery will not be allowed to drive home.   Please read over the following fact sheets that you were given:   Surgical Site Infection Prevention   _x___ Take these medicines the morning of surgery with A SIP OF WATER:    1. zantac  2.   3.   4.  5.  6.  ____ Fleet Enema (as directed)   _x___ Use CHG Soap as directed  _x___ Use inhalers on the day of surgery  ____ Stop metformin 2 days prior to surgery    ____ Take 1/2 of usual insulin dose the night before surgery and none on the morning of surgery.   ____ Stop Coumadin/Plavix/aspirin on  __x__ Stop  Anti-inflammatories on 12/2 May take tylenol   ____ Stop supplements until after surgery.    ____ Bring C-Pap to the hospital.

## 2015-06-21 ENCOUNTER — Other Ambulatory Visit: Payer: Self-pay | Admitting: *Deleted

## 2015-06-21 DIAGNOSIS — D0510 Intraductal carcinoma in situ of unspecified breast: Secondary | ICD-10-CM

## 2015-06-21 NOTE — Telephone Encounter (Signed)
Please have Dr Doy Hutching to refill this for her. Pharmacy notified

## 2015-06-21 NOTE — Telephone Encounter (Signed)
She is on 6 mo fu for DCIS and has PCP Dr Doy Hutching

## 2015-06-25 ENCOUNTER — Ambulatory Visit: Payer: BLUE CROSS/BLUE SHIELD | Admitting: Certified Registered Nurse Anesthetist

## 2015-06-25 ENCOUNTER — Observation Stay
Admission: RE | Admit: 2015-06-25 | Discharge: 2015-06-26 | Disposition: A | Discharge status: Home / Self Care | Payer: BLUE CROSS/BLUE SHIELD | Source: Ambulatory Visit | Attending: Obstetrics and Gynecology | Admitting: Obstetrics and Gynecology

## 2015-06-25 ENCOUNTER — Encounter
Admission: RE | Disposition: A | Discharge status: Home / Self Care | Payer: Self-pay | Source: Ambulatory Visit | Attending: Obstetrics and Gynecology

## 2015-06-25 DIAGNOSIS — R87810 Cervical high risk human papillomavirus (HPV) DNA test positive: Secondary | ICD-10-CM | POA: Diagnosis not present

## 2015-06-25 DIAGNOSIS — K5900 Constipation, unspecified: Secondary | ICD-10-CM | POA: Insufficient documentation

## 2015-06-25 DIAGNOSIS — Z825 Family history of asthma and other chronic lower respiratory diseases: Secondary | ICD-10-CM | POA: Insufficient documentation

## 2015-06-25 DIAGNOSIS — R1031 Right lower quadrant pain: Secondary | ICD-10-CM | POA: Insufficient documentation

## 2015-06-25 DIAGNOSIS — Z6832 Body mass index (BMI) 32.0-32.9, adult: Secondary | ICD-10-CM | POA: Diagnosis not present

## 2015-06-25 DIAGNOSIS — K589 Irritable bowel syndrome without diarrhea: Secondary | ICD-10-CM | POA: Diagnosis not present

## 2015-06-25 DIAGNOSIS — Z170 Estrogen receptor positive status [ER+]: Secondary | ICD-10-CM | POA: Insufficient documentation

## 2015-06-25 DIAGNOSIS — Q438 Other specified congenital malformations of intestine: Secondary | ICD-10-CM | POA: Insufficient documentation

## 2015-06-25 DIAGNOSIS — Z9889 Other specified postprocedural states: Secondary | ICD-10-CM

## 2015-06-25 DIAGNOSIS — N8312 Corpus luteum cyst of left ovary: Secondary | ICD-10-CM | POA: Insufficient documentation

## 2015-06-25 DIAGNOSIS — Z823 Family history of stroke: Secondary | ICD-10-CM | POA: Diagnosis not present

## 2015-06-25 DIAGNOSIS — Z8249 Family history of ischemic heart disease and other diseases of the circulatory system: Secondary | ICD-10-CM | POA: Diagnosis not present

## 2015-06-25 DIAGNOSIS — N72 Inflammatory disease of cervix uteri: Secondary | ICD-10-CM | POA: Diagnosis not present

## 2015-06-25 DIAGNOSIS — F419 Anxiety disorder, unspecified: Secondary | ICD-10-CM | POA: Insufficient documentation

## 2015-06-25 DIAGNOSIS — Z853 Personal history of malignant neoplasm of breast: Secondary | ICD-10-CM | POA: Insufficient documentation

## 2015-06-25 DIAGNOSIS — N8301 Follicular cyst of right ovary: Secondary | ICD-10-CM | POA: Insufficient documentation

## 2015-06-25 DIAGNOSIS — E669 Obesity, unspecified: Secondary | ICD-10-CM | POA: Diagnosis not present

## 2015-06-25 DIAGNOSIS — N8302 Follicular cyst of left ovary: Secondary | ICD-10-CM | POA: Diagnosis not present

## 2015-06-25 DIAGNOSIS — N838 Other noninflammatory disorders of ovary, fallopian tube and broad ligament: Secondary | ICD-10-CM | POA: Diagnosis not present

## 2015-06-25 DIAGNOSIS — R102 Pelvic and perineal pain: Secondary | ICD-10-CM | POA: Diagnosis present

## 2015-06-25 DIAGNOSIS — Z79810 Long term (current) use of selective estrogen receptor modulators (SERMs): Secondary | ICD-10-CM | POA: Insufficient documentation

## 2015-06-25 DIAGNOSIS — Z803 Family history of malignant neoplasm of breast: Secondary | ICD-10-CM | POA: Insufficient documentation

## 2015-06-25 DIAGNOSIS — Z79899 Other long term (current) drug therapy: Secondary | ICD-10-CM | POA: Insufficient documentation

## 2015-06-25 HISTORY — PX: CYSTOSCOPY: SHX5120

## 2015-06-25 HISTORY — PX: ROBOTIC ASSISTED TOTAL HYSTERECTOMY: SHX6085

## 2015-06-25 LAB — POCT PREGNANCY, URINE: PREG TEST UR: NEGATIVE

## 2015-06-25 SURGERY — ROBOTIC ASSISTED TOTAL HYSTERECTOMY
Anesthesia: General | Wound class: Clean Contaminated

## 2015-06-25 MED ORDER — MIDAZOLAM HCL 2 MG/2ML IJ SOLN
INTRAMUSCULAR | Status: DC | PRN
  Administered 2015-06-25: 2 mg via INTRAVENOUS

## 2015-06-25 MED ORDER — LACTATED RINGERS IV SOLN
INTRAVENOUS | Status: DC
  Administered 2015-06-25 (×2): via INTRAVENOUS

## 2015-06-25 MED ORDER — IBUPROFEN 600 MG PO TABS
600.0000 mg | ORAL_TABLET | Freq: Four times a day (QID) | ORAL | Status: DC | PRN
  Administered 2015-06-25 – 2015-06-26 (×3): 600 mg via ORAL
  Filled 2015-06-25 (×3): qty 1

## 2015-06-25 MED ORDER — HYDROMORPHONE HCL 1 MG/ML IJ SOLN
INTRAMUSCULAR | Status: AC
  Filled 2015-06-25: qty 1

## 2015-06-25 MED ORDER — DEXAMETHASONE SODIUM PHOSPHATE 10 MG/ML IJ SOLN
INTRAMUSCULAR | Status: DC | PRN
  Administered 2015-06-25: 10 mg via INTRAVENOUS
  Administered 2015-06-25: 5 mg via INTRAVENOUS

## 2015-06-25 MED ORDER — LIDOCAINE HCL (CARDIAC) 20 MG/ML IV SOLN
INTRAVENOUS | Status: DC | PRN
  Administered 2015-06-25: 50 mg via INTRAVENOUS

## 2015-06-25 MED ORDER — HYDROMORPHONE HCL 1 MG/ML IJ SOLN
0.2000 mg | INTRAMUSCULAR | Status: DC | PRN
  Administered 2015-06-25 (×2): 0.6 mg via INTRAVENOUS
  Filled 2015-06-25 (×2): qty 1

## 2015-06-25 MED ORDER — CLONAZEPAM 0.5 MG PO TABS: 0.5000 mg | ORAL_TABLET | Freq: Two times a day (BID) | ORAL | Status: DC | PRN

## 2015-06-25 MED ORDER — LACTATED RINGERS IV SOLN
INTRAVENOUS | Status: DC
  Administered 2015-06-25: 10:00:00 via INTRAVENOUS

## 2015-06-25 MED ORDER — BUPIVACAINE HCL (PF) 0.5 % IJ SOLN
INTRAMUSCULAR | Status: AC
  Filled 2015-06-25: qty 30

## 2015-06-25 MED ORDER — TOPIRAMATE 100 MG PO TABS: 50.0000 mg | ORAL_TABLET | Freq: Every day | ORAL | Status: DC

## 2015-06-25 MED ORDER — ONDANSETRON HCL 4 MG PO TABS: 4.0000 mg | ORAL_TABLET | Freq: Four times a day (QID) | ORAL | Status: DC | PRN

## 2015-06-25 MED ORDER — HYDROMORPHONE HCL 1 MG/ML IJ SOLN: 0.2500 mg | INTRAMUSCULAR | Status: DC | PRN

## 2015-06-25 MED ORDER — ACETAMINOPHEN 10 MG/ML IV SOLN
INTRAVENOUS | Status: DC | PRN
  Administered 2015-06-25: 1000 mg via INTRAVENOUS

## 2015-06-25 MED ORDER — ALBUTEROL SULFATE (2.5 MG/3ML) 0.083% IN NEBU: 2.5000 mg | INHALATION_SOLUTION | Freq: Four times a day (QID) | RESPIRATORY_TRACT | Status: DC | PRN

## 2015-06-25 MED ORDER — KETOROLAC TROMETHAMINE 30 MG/ML IJ SOLN
INTRAMUSCULAR | Status: DC | PRN
  Administered 2015-06-25: 30 mg via INTRAVENOUS

## 2015-06-25 MED ORDER — ESCITALOPRAM OXALATE 10 MG PO TABS: 20.0000 mg | ORAL_TABLET | Freq: Every day | ORAL | Status: DC

## 2015-06-25 MED ORDER — DOCUSATE SODIUM 100 MG PO CAPS
100.0000 mg | ORAL_CAPSULE | Freq: Two times a day (BID) | ORAL | Status: DC
  Administered 2015-06-25: 100 mg via ORAL
  Filled 2015-06-25: qty 1

## 2015-06-25 MED ORDER — TAMOXIFEN CITRATE 20 MG PO TABS
20.0000 mg | ORAL_TABLET | Freq: Every day | ORAL | Status: DC
  Filled 2015-06-25 (×2): qty 1

## 2015-06-25 MED ORDER — ONDANSETRON HCL 4 MG/2ML IJ SOLN: 4.0000 mg | Freq: Four times a day (QID) | INTRAMUSCULAR | Status: DC | PRN

## 2015-06-25 MED ORDER — FENTANYL CITRATE (PF) 100 MCG/2ML IJ SOLN
25.0000 ug | INTRAMUSCULAR | Status: DC | PRN
  Administered 2015-06-25 (×4): 25 ug via INTRAVENOUS

## 2015-06-25 MED ORDER — SUGAMMADEX SODIUM 200 MG/2ML IV SOLN
INTRAVENOUS | Status: DC | PRN
  Administered 2015-06-25: 165 mg via INTRAVENOUS

## 2015-06-25 MED ORDER — CEFAZOLIN SODIUM-DEXTROSE 2-3 GM-% IV SOLR
2.0000 g | INTRAVENOUS | Status: AC
  Administered 2015-06-25: 2 g via INTRAVENOUS

## 2015-06-25 MED ORDER — PROPOFOL 10 MG/ML IV BOLUS
INTRAVENOUS | Status: DC | PRN
Start: 2015-06-25 — End: 2015-06-25
  Administered 2015-06-25: 200 mg via INTRAVENOUS

## 2015-06-25 MED ORDER — FENTANYL CITRATE (PF) 100 MCG/2ML IJ SOLN
INTRAMUSCULAR | Status: DC | PRN
  Administered 2015-06-25 (×5): 50 ug via INTRAVENOUS
  Administered 2015-06-25: 100 ug via INTRAVENOUS

## 2015-06-25 MED ORDER — CEFAZOLIN SODIUM-DEXTROSE 2-3 GM-% IV SOLR
INTRAVENOUS | Status: AC
Start: 2015-06-25 — End: 2015-06-25
  Filled 2015-06-25: qty 50

## 2015-06-25 MED ORDER — BUPIVACAINE HCL (PF) 0.5 % IJ SOLN
INTRAMUSCULAR | Status: DC | PRN
  Administered 2015-06-25: 24 mL

## 2015-06-25 MED ORDER — ONDANSETRON HCL 4 MG/2ML IJ SOLN
INTRAMUSCULAR | Status: DC | PRN
  Administered 2015-06-25: 4 mg via INTRAVENOUS

## 2015-06-25 MED ORDER — MENTHOL 3 MG MT LOZG
1.0000 | LOZENGE | OROMUCOSAL | Status: DC | PRN
  Filled 2015-06-25: qty 9

## 2015-06-25 MED ORDER — ACETAMINOPHEN 10 MG/ML IV SOLN
INTRAVENOUS | Status: AC
  Filled 2015-06-25: qty 100

## 2015-06-25 MED ORDER — METHYLENE BLUE 1 % INJ SOLN
INTRAMUSCULAR | Status: AC
  Filled 2015-06-25: qty 10

## 2015-06-25 MED ORDER — ROCURONIUM BROMIDE 100 MG/10ML IV SOLN
INTRAVENOUS | Status: DC | PRN
  Administered 2015-06-25: 50 mg via INTRAVENOUS
  Administered 2015-06-25 (×2): 20 mg via INTRAVENOUS

## 2015-06-25 MED ORDER — OXYCODONE-ACETAMINOPHEN 5-325 MG PO TABS
1.0000 | ORAL_TABLET | ORAL | Status: DC | PRN
  Administered 2015-06-25 – 2015-06-26 (×5): 2 via ORAL
  Filled 2015-06-25 (×5): qty 2

## 2015-06-25 MED ORDER — ALBUTEROL SULFATE HFA 108 (90 BASE) MCG/ACT IN AERS: 2.0000 | INHALATION_SPRAY | Freq: Four times a day (QID) | RESPIRATORY_TRACT | Status: DC | PRN

## 2015-06-25 MED ORDER — KETOROLAC TROMETHAMINE 30 MG/ML IJ SOLN
30.0000 mg | Freq: Once | INTRAMUSCULAR | Status: AC
  Administered 2015-06-25: 30 mg via INTRAVENOUS
  Filled 2015-06-25: qty 1

## 2015-06-25 MED ORDER — ONDANSETRON HCL 4 MG/2ML IJ SOLN: 4.0000 mg | Freq: Once | INTRAMUSCULAR | Status: DC | PRN

## 2015-06-25 MED ORDER — FENTANYL CITRATE (PF) 100 MCG/2ML IJ SOLN
INTRAMUSCULAR | Status: AC
  Filled 2015-06-25: qty 2

## 2015-06-25 SURGICAL SUPPLY — 67 items
BAG URO DRAIN 2000ML W/SPOUT (MISCELLANEOUS) ×4 IMPLANT
BLADE SURG SZ11 CARB STEEL (BLADE) ×4 IMPLANT
CANISTER SUCT 1200ML W/VALVE (MISCELLANEOUS) ×4 IMPLANT
CANNULA SEALS 8.5MM (CANNULA) ×2
CATH FOLEY 2WAY  5CC 16FR (CATHETERS) ×2
CATH URTH 16FR FL 2W BLN LF (CATHETERS) ×2 IMPLANT
CHLORAPREP W/TINT 26ML (MISCELLANEOUS) ×4 IMPLANT
CORD BIP STRL DISP 12FT (MISCELLANEOUS) ×4 IMPLANT
CORD MONOPOLAR M/FML 12FT (MISCELLANEOUS) IMPLANT
COVER TIP SHEARS 8 DVNC (MISCELLANEOUS) ×2 IMPLANT
COVER TIP SHEARS 8MM DA VINCI (MISCELLANEOUS) ×2
CRADLE LAMINECT ARM (MISCELLANEOUS) ×4 IMPLANT
DEFOGGER SCOPE WARMER CLEARIFY (MISCELLANEOUS) ×4 IMPLANT
DRAPE 3 ARM ACCESS DA VINCI (DRAPES) ×2
DRAPE 3 ARM ACCESS DVNC (DRAPES) ×2 IMPLANT
DRAPE CAMERA ARM DAVINCI SIROB (DRAPES) ×4 IMPLANT
DRAPE LEGGINS SURG 28X43 STRL (DRAPES) ×4 IMPLANT
DRAPE SHEET LG 3/4 BI-LAMINATE (DRAPES) ×12 IMPLANT
DRAPE UNDER BUTTOCK W/FLU (DRAPES) IMPLANT
FILTER LAP SMOKE EVAC STRL (MISCELLANEOUS) ×4 IMPLANT
GLOVE BIO SURGEON STRL SZ 6.5 (GLOVE) ×18 IMPLANT
GLOVE BIO SURGEONS STRL SZ 6.5 (GLOVE) ×6
GLOVE INDICATOR 7.0 STRL GRN (GLOVE) ×24 IMPLANT
GOWN STRL REUS W/ TWL LRG LVL3 (GOWN DISPOSABLE) ×16 IMPLANT
GOWN STRL REUS W/TWL LRG LVL3 (GOWN DISPOSABLE) ×16
GRASPER SUT TROCAR 14GX15 (MISCELLANEOUS) ×4 IMPLANT
IRRIGATION STRYKERFLOW (MISCELLANEOUS) ×2 IMPLANT
IRRIGATOR STRYKERFLOW (MISCELLANEOUS) ×4
IV NS 1000ML (IV SOLUTION) ×2
IV NS 1000ML BAXH (IV SOLUTION) ×2 IMPLANT
KIT PINK PAD W/HEAD ARE REST (MISCELLANEOUS) ×4
KIT PINK PAD W/HEAD ARM REST (MISCELLANEOUS) ×2 IMPLANT
KIT RM TURNOVER CYSTO AR (KITS) ×4 IMPLANT
LABEL OR SOLS (LABEL) ×4 IMPLANT
LIQUID BAND (GAUZE/BANDAGES/DRESSINGS) ×4 IMPLANT
MANIPULATOR VCARE LG CRV RETR (MISCELLANEOUS) IMPLANT
MANIPULATOR VCARE STD CRV RETR (MISCELLANEOUS) IMPLANT
NEEDLE VERESS 14GA 120MM (NEEDLE) ×4 IMPLANT
NS IRRIG 1000ML POUR BTL (IV SOLUTION) ×4 IMPLANT
OCCLUDER COLPOPNEUMO (BALLOONS) ×4 IMPLANT
PACK GYN LAPAROSCOPIC (MISCELLANEOUS) ×4 IMPLANT
PAD GROUND ADULT SPLIT (MISCELLANEOUS) ×4 IMPLANT
PAD OB MATERNITY 4.3X12.25 (PERSONAL CARE ITEMS) ×4 IMPLANT
PAD PREP 24X41 OB/GYN DISP (PERSONAL CARE ITEMS) ×4 IMPLANT
SCISSORS METZENBAUM CVD 33 (INSTRUMENTS) IMPLANT
SEAL CANN 8.5 DVNC (CANNULA) ×2 IMPLANT
SET CYSTO W/LG BORE CLAMP LF (SET/KITS/TRAYS/PACK) IMPLANT
SOLUTION ELECTROLUBE (MISCELLANEOUS) ×4 IMPLANT
SURGILUBE 2OZ TUBE FLIPTOP (MISCELLANEOUS) ×4 IMPLANT
SUT DVC VLOC 180 0 12IN GS21 (SUTURE) ×12
SUT ENDO VLOC 180-0-8IN (SUTURE) IMPLANT
SUT VIC AB 0 CT2 27 (SUTURE) ×4 IMPLANT
SUT VIC AB 1 CT1 36 (SUTURE) ×4 IMPLANT
SUT VIC AB 2-0 CT1 27 (SUTURE) ×2
SUT VIC AB 2-0 CT1 TAPERPNT 27 (SUTURE) ×2 IMPLANT
SUT VIC AB 4-0 SH 27 (SUTURE) ×4
SUT VIC AB 4-0 SH 27XANBCTRL (SUTURE) ×4 IMPLANT
SUT VICRYL 0 AB UR-6 (SUTURE) ×8 IMPLANT
SUTURE DVC VLC 180 0 12IN GS21 (SUTURE) ×6 IMPLANT
SYR 50ML LL SCALE MARK (SYRINGE) ×4 IMPLANT
SYRINGE 10CC LL (SYRINGE) ×4 IMPLANT
TROCAR 12M 150ML BLUNT (TROCAR) ×4 IMPLANT
TROCAR DISP BLADELESS 8 DVNC (TROCAR) ×2 IMPLANT
TROCAR DISP BLADELESS 8MM (TROCAR) ×2
TROCAR ENDO BLADELESS 11MM (ENDOMECHANICALS) ×4 IMPLANT
TROCAR XCEL 12X100 BLDLESS (ENDOMECHANICALS) ×4 IMPLANT
TUBING INSUFFLATOR HEATED (MISCELLANEOUS) ×4 IMPLANT

## 2015-06-25 NOTE — Anesthesia Procedure Notes (Signed)
Procedure Name: Intubation Performed by: Isabella Ida Pre-anesthesia Checklist: Patient identified, Patient being monitored, Timeout performed, Emergency Drugs available and Suction available Patient Re-evaluated:Patient Re-evaluated prior to inductionOxygen Delivery Method: Circle system utilized Preoxygenation: Pre-oxygenation with 100% oxygen Intubation Type: IV induction Ventilation: Mask ventilation without difficulty Laryngoscope Size: Miller and 2 Grade View: Grade I Tube type: Oral Tube size: 7.0 mm Number of attempts: 1 Airway Equipment and Method: Stylet Placement Confirmation: ETT inserted through vocal cords under direct vision,  positive ETCO2 and breath sounds checked- equal and bilateral Secured at: 21 cm Tube secured with: Tape Dental Injury: Teeth and Oropharynx as per pre-operative assessment        

## 2015-06-25 NOTE — Anesthesia Preprocedure Evaluation (Signed)
Anesthesia Evaluation  Patient identified by MRN, date of birth, ID band Patient awake    Reviewed: Allergy & Precautions, H&P , NPO status , Patient's Chart, lab work & pertinent test results, reviewed documented beta blocker date and time   Airway Mallampati: II  TM Distance: >3 FB Neck ROM: full    Dental  (+) Teeth Intact   Pulmonary neg pulmonary ROS, Current Smoker,    Pulmonary exam normal        Cardiovascular negative cardio ROS Normal cardiovascular exam Rhythm:regular Rate:Normal     Neuro/Psych  Headaches, negative neurological ROS  negative psych ROS   GI/Hepatic negative GI ROS, Neg liver ROS,   Endo/Other  negative endocrine ROS  Renal/GU negative Renal ROS  negative genitourinary   Musculoskeletal   Abdominal   Peds  Hematology negative hematology ROS (+)   Anesthesia Other Findings   Reproductive/Obstetrics negative OB ROS                             Anesthesia Physical Anesthesia Plan  ASA: II  Anesthesia Plan: General ETT   Post-op Pain Management:    Induction:   Airway Management Planned:   Additional Equipment:   Intra-op Plan:   Post-operative Plan:   Informed Consent: I have reviewed the patients History and Physical, chart, labs and discussed the procedure including the risks, benefits and alternatives for the proposed anesthesia with the patient or authorized representative who has indicated his/her understanding and acceptance.     Plan Discussed with: CRNA  Anesthesia Plan Comments:         Anesthesia Quick Evaluation

## 2015-06-25 NOTE — Interval H&P Note (Signed)
History and Physical Interval Note:  06/25/2015 9:13 AM  Carla Hunt  has presented today for surgery, with the diagnosis of PELVIC PAIN,BREAST CANCER,PRIOR ABLATION  The various methods of treatment have been discussed with the patient and family. After consideration of risks, benefits and other options for treatment, the patient has consented to  Procedure(s): ROBOTIC ASSISTED TOTAL HYSTERECTOMY/BSO (N/A) as a surgical intervention .  The patient's history has been reviewed, patient examined, no change in status, stable for surgery.  I have reviewed the patient's chart and labs.  Questions were answered to the patient's satisfaction.     Benjaman Kindler

## 2015-06-25 NOTE — Transfer of Care (Signed)
Immediate Anesthesia Transfer of Care Note  Patient: Carla Hunt  Procedure(s) Performed: Procedure(s): ROBOTIC ASSISTED TOTAL HYSTERECTOMY/BSO/LYSIS OF ADHESIONS (N/A) CYSTOSCOPY  Patient Location: PACU  Anesthesia Type:General  Level of Consciousness: awake and sedated  Airway & Oxygen Therapy: Patient Spontanous Breathing and Patient connected to nasal cannula oxygen  Post-op Assessment: Report given to RN and Post -op Vital signs reviewed and stable  Post vital signs: Reviewed and stable  Last Vitals:  Filed Vitals:   06/25/15 0807  BP: 133/88  Pulse: 97  Temp: 36.7 C  Resp: 18    Complications: No apparent anesthesia complications

## 2015-06-25 NOTE — Progress Notes (Signed)
Day of Surgery Subjective: The patient is doing well.  No nausea or vomiting. Pain is adequately controlled.  Objective: Vital signs in last 24 hours: Temp:  [98.1 F (36.7 C)-99.6 F (37.6 C)] 99.6 F (37.6 C) (12/09 1321) Pulse Rate:  [74-97] 77 (12/09 1504) Resp:  [7-18] 15 (12/09 1422) BP: (113-133)/(63-88) 119/72 mmHg (12/09 1504) SpO2:  [97 %-100 %] 100 % (12/09 1504) Weight:  [81.647 kg (180 lb)] 81.647 kg (180 lb) (12/09 0807)  Intake/Output  Intake/Output Summary (Last 24 hours) at 06/25/15 1536 Last data filed at 06/25/15 1320  Gross per 24 hour  Intake   1500 ml  Output    625 ml  Net    875 ml    Physical Exam:  General: Alert and oriented. CV: RRR Lungs: Clear bilaterally. GI: Soft, Nondistended. Incisions: Clean and dry. Urine: Clear, Foley in place Extremities: Nontender, no erythema, no edema.  Lab Results: No results for input(s): HGB, HCT, WBC, PLT in the last 72 hours.               Results for orders placed or performed during the hospital encounter of 06/25/15 (from the past 24 hour(s))  Pregnancy, urine POC     Status: None   Collection Time: 06/25/15  8:07 AM  Result Value Ref Range   Preg Test, Ur NEGATIVE NEGATIVE    Assessment/Plan: POD# 0 s/p RATLH, BSO and cystoscopy.  1) Ambulate, Incentive spirometry 2) Advance diet as tolerated 3) Transition to oral pain medication in the am 4) D/C Foley this evening if tolerated, or in the am if desired. 5) Discharge home tomorrow anticipated    Benjaman Kindler, MD     Benjaman Kindler 06/25/2015, 3:36 PM

## 2015-06-25 NOTE — Anesthesia Postprocedure Evaluation (Signed)
Anesthesia Post Note  Patient: Carla Hunt  Procedure(s) Performed: Procedure(s) (LRB): ROBOTIC ASSISTED TOTAL HYSTERECTOMY/BSO/LYSIS OF ADHESIONS (N/A) CYSTOSCOPY  Patient location during evaluation: PACU Anesthesia Type: General Level of consciousness: awake and alert Pain management: satisfactory to patient Vital Signs Assessment: post-procedure vital signs reviewed and stable Respiratory status: respiratory function stable Cardiovascular status: stable Anesthetic complications: no    Last Vitals:  Filed Vitals:   06/25/15 1350 06/25/15 1357  BP: 122/81   Pulse: 90 83  Temp:    Resp: 14 14    Last Pain:  Filed Vitals:   06/25/15 1357  PainSc: 5                  VAN STAVEREN,Justinn Welter

## 2015-06-25 NOTE — Progress Notes (Signed)
Patient arrived to the Mother/Baby Unit with two IV's. One in her left hand and One in her right hand. 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 RN educated patient on signs and symptoms to look for in case a issue arises.

## 2015-06-25 NOTE — Op Note (Signed)
Operative Note   SURGERY DATE: 06/25/2015  PRE-OP DIAGNOSIS:  1. Pelvic pain 2. 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 Pelvic pain 2. 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 Redundant colon  PROCEDURES: 1. Exam under anesthesia 2. ROBOT ASSISTED LAPAROSCOPY, SURGICAL, WITH TOTAL HYSTERECTOMY, FOR UTERUS 250 G OR LESS; WITH REMOVAL OF TUBE(S) AND / OR OVARY(S) 3. Cystoscopy  SURGEON: Angelina Pih, MD, MPH  ASSISTANT(S):  Surgical Assistant: Link Snuffer, PA-C  STAFF -  Circulator: Gustavo Lah, RN, BSN Scrub Tech: Justice Britain, CST  ANESTHESIA: General   ESTIMATED BLOOD LOSS: 200 mL  DRAINS: Foley catheter  TOTAL IV FLUIDS: 99991111 mL  COMPLICATIONS: None  DISPOSITION: PACU - hemodynamically stable.  CONDITION: stable  Antibiotics: Ancef 2g iv within 1 hour prior to start of procedure  VTE prophylaxis: was ordered perioperatively.  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  OPERATIVE FINDINGS:  Pelvic: External genitalia negative for lesions. Vagina negative. Adenxa negative for masses or nodularity. Cervix without gross lesions. Uterus mobile, anteverted, small.  Intraoperative findings revealed a normal upper abdomen including bowel, diaphragmatic surfaces, stomach, and omentum.  The uterus was moble.  The right and left ovaries appeared normal.  Bilateral tubes appeared normal.  Adhesions between uterus and anterior abdominal wall from prior C/S and left colon to left pelvic sidewall. These adhesions did not compromise safety of surgery, although bleeding resulted from the alteration in anatomy.  PROCEDURE IN DETAIL: After informed consent was obtained, the patient was taken to the operating room where general anesthesia was obtained without  difficulty. The patient was positioned in the dorsal lithotomy position in [place] and her arms were carefully tucked at her sides and the usual precautions were taken.  She was prepped and draped in normal sterile fashion.  Time-out was performed and a Foley catheter was placed into the bladder. A standard VCare uterine manipulator was then placed in the uterus without incident.  Preoperative prophylactic antibiotics were given through her iv.  After infiltration of local anesthetic at the proposed trocar sites, an 8 mm incision was created infraumbilically. A Veress needle was carefully introduced with the gas on low-flow, and entry into the peritoneal cavity was assured with a low pressure noted. Pneumoperitoneum was created to a pressure of 15 mm Hg. The camera port was placed and the abdomin surveyed, noting intact bowel below the site of entry. A survey of the pelvis and upper abdomen revealed the above findings. Right and left lateral 8-mm robotic ports and a 5-12 mm right-sided assistant port were placed under direct visualization.  The patient was placed in deepTrendelenburg and the bowel was displaced up into the upper abdomen. The robot was left side docked. The instruments were placed under direct visualization.   The ureters were identified bilaterally coursing outside of the operative field. Round ligaments were divided on each side with the EndoShears and the retroperitoneal space was opened bilaterally. The posterior leaflet of the broad was taken down to the level of the IP ligament. The anterior leaflet of the broad ligament was carefully taken down to the midline.  A bladder flap was created and the bladder was dissected down off the lower uterine segment and cervix using endoshears and electrocautery.   The Fallopian tubes were divided from the ovaries, and care taken to  hemostatically transect the utero-ovarian ligament. The peritoneum was taken down to the level of the internal  os, and the uterine arteries skeletonized. With strong cephalad pressure from the V-care, bipolar cautery was used to seal and transect the uterine arteries, and the pedicles allowed to fall away laterally.  Ovariolysis was performed and the ovary was dissected medially with care to avoid the ureter.  The infundibulopelvic ligaments were skeletonized, sealed and divided. The peritoneum was taken down to the level of the internal os, and the uterine arteries skeletonized. With strong cephalad pressure from the V-care, bipolar cautery was used to seal and transect the uterine arteries, and the pedicles allowed to fall away laterally.  A colpotomy was performed circumferentially along the V-Care ring with monopolar electrocautery and the cervix was incised from the vagina. The specimen was removed through the vagina.  A pneumo balloon was placed in the vagina and the vaginal cuff was then closed in a running continuous fashion using the  0 V-Lock suture with careful attention to include the vaginal cuff angles and the vaginal mucosa within the closure.  Hemostasis was secured with suction-irrigation.The intraperitoneal pressure was dropped, and all planes of dissection, vascular pedicles and the vaginal cuff were found to be hemostatic.  The robot was undocked. The assistant port trocar was removed and the fascia was closed with 0 Vicryl suture. The lateral trocars were removed under visualization.  The CO2 gas was released and several deep breaths given to remove any remaining CO2 from the peritoneal cavity.  The skin incisions were closed with 4-0 Monocryl subcuticular stitch and pressure dressings placed.   CYSTOCOPY PARAGRAPH The Foley catheter was temporarily removed and an uncomplicated cystoscopy was performed. Excellent efflux was noted from both ureteral orifices. The bladder mucosa was intact. The Foley catheter was replaced for postoperative care.   Anesthesia was reversed without  difficulty.  The patient tolerated the procedure well.  Sponge, lap and needle counts were correct x2.  The patient was taken to recovery room in excellent condition.

## 2015-06-26 DIAGNOSIS — N838 Other noninflammatory disorders of ovary, fallopian tube and broad ligament: Secondary | ICD-10-CM | POA: Diagnosis not present

## 2015-06-26 LAB — CBC
HCT: 34 % — ABNORMAL LOW (ref 35.0–47.0)
HEMOGLOBIN: 11.5 g/dL — AB (ref 12.0–16.0)
MCH: 30.4 pg (ref 26.0–34.0)
MCHC: 33.7 g/dL (ref 32.0–36.0)
MCV: 90 fL (ref 80.0–100.0)
PLATELETS: 173 10*3/uL (ref 150–440)
RBC: 3.78 MIL/uL — AB (ref 3.80–5.20)
RDW: 13.1 % (ref 11.5–14.5)
WBC: 15.6 10*3/uL — AB (ref 3.6–11.0)

## 2015-06-26 LAB — BASIC METABOLIC PANEL
Anion gap: 6 (ref 5–15)
BUN: 10 mg/dL (ref 6–20)
CO2: 24 mmol/L (ref 22–32)
Calcium: 8.3 mg/dL — ABNORMAL LOW (ref 8.9–10.3)
Chloride: 108 mmol/L (ref 101–111)
Creatinine, Ser: 0.63 mg/dL (ref 0.44–1.00)
Glucose, Bld: 133 mg/dL — ABNORMAL HIGH (ref 65–99)
POTASSIUM: 3.9 mmol/L (ref 3.5–5.1)
SODIUM: 138 mmol/L (ref 135–145)

## 2015-06-26 MED ORDER — DOCUSATE SODIUM 100 MG PO CAPS: 100.0000 mg | ORAL_CAPSULE | Freq: Two times a day (BID) | ORAL | Status: DC

## 2015-06-26 MED ORDER — OXYCODONE-ACETAMINOPHEN 5-325 MG PO TABS: 1.0000 | ORAL_TABLET | ORAL | Status: DC | PRN

## 2015-06-26 MED ORDER — IBUPROFEN 800 MG PO TABS: 800.0000 mg | ORAL_TABLET | Freq: Three times a day (TID) | ORAL | Status: DC | PRN

## 2015-06-26 MED ORDER — LACTULOSE 10 GM/15ML PO SOLN: 20.0000 g | Freq: Three times a day (TID) | ORAL | Status: DC | PRN

## 2015-06-26 NOTE — Discharge Instructions (Signed)
Discharge instructions after  robotically-assisted total laparoscopic hysterectomy  Signs and Symptoms to Report Call our office at [phone] if you have any of the following.   Fever over 100.4 degrees or higher  Severe stomach pain not relieved with pain medications  Bright red bleeding thats heavier than a period that does not slow with rest  To go the bathroom a lot (frequency), you cant hold your urine (urgency), or it hurts when you empty your bladder (urinate)  Chest pain  Shortness of breath  Pain in the calves of your legs  Severe nausea and vomiting not relieved with anti-nausea medications  Signs of infection around your wounds, such as redness, hot to touch, swelling, green/yellow drainage (like pus), bad smelling discharge  Any concerns  What You Can Expect after Surgery  You may see some pink tinged, bloody fluid and bruising around the wound. This is normal.  You may notice shoulder and neck pain. This is caused by the gas used during surgery to expand your abdomen so your surgeon could get to the uterus easier.  You may have a sore throat because of the tube in your mouth during general anesthesia. This will go away in 2 to 3 days.  You may have some stomach cramps.  You may notice spotting on your panties.  You may have pain around the incision sites.   Activities after Your Discharge Follow these guidelines to help speed your recovery at home:  Do the coughing and deep breathing as you did in the hospital for 2 weeks. Use the small blue breathing device, called the incentive spirometer for 2 weeks.  Dont drive if you are in pain or taking narcotic pain medicine. You may drive when you can safely slam on the brakes, turn the wheel forcefully, and rotate your torso comfortably. This is typically 1-2 weeks. Practice in a parking lot or side street prior to attempting to drive regularly.   Ask others to help with household chores for 4 weeks.   Do not lift anything heavier that 10 pounds for 4-6 weeks. This includes pets, children, and groceries.  Dont do strenuous activities, exercises, or sports like vacuuming, tennis, squash, etc. until your doctor says it is safe to do so. ---Maintain pelvic rest for 12 weeks. This means nothing in the vagina at all (no douching, tampons, intercourse) for 12 weeks.   Walk as you feel able. Rest often since it may take two or three weeks for your energy level to return to normal.   You may climb stairs  Avoid constipation:   -Eat fruits, vegetables, and whole grains. Eat small meals as your appetite will take time to return to normal.   -Drink 6 to 8 glasses of water each day unless your doctor has told you to limit your fluids.   -Use a laxative or stool softener as needed if constipation becomes a problem. You may take Miralax, metamucil, Citrucil, Colace, Senekot, FiberCon, etc. If this does not relieve the constipation, try two tablespoons of Milk Of Magnesia every 8 hours until your bowels move.   You may shower. Gently wash the wounds with a mild soap and water. Pat dry.  Do not get in a hot tub, swimming pool, etc. for 6 weeks.  Do not use lotions, oils, powders on the wounds.  Do not douche, use tampons, or have sex until your doctor says it is okay.  Take your pain medicine when you need it. The medicine may not work  as well if the pain is bad. ° °Take the medicines you were taking before surgery. Other medications you will need are pain medications (Norco or Percocet) and nausea medications (Zofran).  ° ° °

## 2015-06-26 NOTE — Discharge Summary (Signed)
Physician Discharge Summary  Patient ID: Carla Hunt MRN: [id number] DOB/AGE: January 13, 1971 44 y.o.  Admit date: 06/25/2015 Discharge date: 06/26/2015  Admission Diagnoses:  Discharge Diagnoses:  Active Problems:   Postoperative state   Discharged Condition: good  Hospital Course: Admitted from overnight monitoring after robotically assisted total laparoscopic hysterectomy with BSO for pelvic pain. On POD#1, pt was ambulating without assistance, tolerating regular PO diet, voiding spontaneously, and her pain was controlled with oral medications.  Consults: None  Significant Diagnostic Studies:  LABS- WBC 15.6 from 8.2 HGB 11.5 from 13.2  Discharge Exam: Blood pressure 117/61, pulse 69, temperature 98.4 F (36.9 C), temperature source Axillary, resp. rate 18, height 5\' 2"  (1.575 m), weight 81.647 kg (180 lb), last menstrual period 06/24/2005, SpO2 100 %. General appearance: alert, cooperative and no distress Resp: clear to auscultation bilaterally Cardio: regular rate and rhythm, S1, S2 normal, no murmur, click, rub or gallop GI: soft, non-tender; bowel sounds normal; no masses,  no organomegaly Extremities: extremities normal, atraumatic, no cyanosis or edema Pulses: 2+ and symmetric  Disposition: Final discharge disposition not confirmed     Medication List    TAKE these medications        clonazePAM 0.5 MG tablet  Commonly known as:  KLONOPIN  Take 1 tablet (0.5 mg total) by mouth 2 (two) times daily as needed for anxiety.     docusate sodium 100 MG capsule  Commonly known as:  COLACE  Take 1 capsule (100 mg total) by mouth 2 (two) times daily.     escitalopram 20 MG tablet  Commonly known as:  LEXAPRO  Take 1 tablet (20 mg total) by mouth daily.     hyoscyamine 0.125 MG tablet  Commonly known as:  LEVSIN, ANASPAZ  Take 0.125 mg by mouth 3 (three) times daily as needed.     ibuprofen 800 MG tablet  Commonly known as:  ADVIL,MOTRIN  Take by mouth.     ibuprofen 800 MG tablet  Commonly known as:  ADVIL,MOTRIN  Take 1 tablet (800 mg total) by mouth every 8 (eight) hours as needed for moderate pain.     oxyCODONE-acetaminophen 5-325 MG tablet  Commonly known as:  PERCOCET/ROXICET  Take 1-2 tablets by mouth every 4 (four) hours as needed (moderate to severe pain (when tolerating fluids)).     PROAIR HFA 108 (90 BASE) MCG/ACT inhaler  Generic drug:  albuterol     tamoxifen 20 MG tablet  Commonly known as:  NOLVADEX     topiramate 50 MG tablet  Commonly known as:  TOPAMAX  Take by mouth.           Follow-up Information    Follow up with Benjaman Kindler, MD In 2 weeks.   Specialty:  Obstetrics and Gynecology   Why:  For postop check   Contact information:   [address] [place] [address] [phone]       Signed: Benjaman Kindler 06/26/2015, 8:43 AM

## 2015-06-28 LAB — SURGICAL PATHOLOGY

## 2015-07-29 ENCOUNTER — Other Ambulatory Visit: Payer: Self-pay

## 2015-07-29 MED ORDER — TAMOXIFEN CITRATE 20 MG PO TABS: 20.0000 mg | ORAL_TABLET | Freq: Every day | ORAL | Status: DC

## 2015-08-02 ENCOUNTER — Inpatient Hospital Stay: Payer: BLUE CROSS/BLUE SHIELD | Attending: Oncology | Admitting: Oncology

## 2015-08-02 DIAGNOSIS — Z79811 Long term (current) use of aromatase inhibitors: Secondary | ICD-10-CM | POA: Insufficient documentation

## 2015-08-02 DIAGNOSIS — K589 Irritable bowel syndrome without diarrhea: Secondary | ICD-10-CM | POA: Insufficient documentation

## 2015-08-02 DIAGNOSIS — Z170 Estrogen receptor positive status [ER+]: Secondary | ICD-10-CM | POA: Insufficient documentation

## 2015-08-02 DIAGNOSIS — D0511 Intraductal carcinoma in situ of right breast: Secondary | ICD-10-CM | POA: Insufficient documentation

## 2015-08-02 DIAGNOSIS — Z79899 Other long term (current) drug therapy: Secondary | ICD-10-CM | POA: Insufficient documentation

## 2015-08-11 ENCOUNTER — Inpatient Hospital Stay (HOSPITAL_BASED_OUTPATIENT_CLINIC_OR_DEPARTMENT_OTHER): Payer: BLUE CROSS/BLUE SHIELD | Admitting: Oncology

## 2015-08-11 VITALS — BP 109/78 | HR 93 | Temp 99.4°F | Resp 16 | Wt 192.7 lb

## 2015-08-11 DIAGNOSIS — D0511 Intraductal carcinoma in situ of right breast: Secondary | ICD-10-CM

## 2015-08-11 DIAGNOSIS — K589 Irritable bowel syndrome without diarrhea: Secondary | ICD-10-CM | POA: Diagnosis not present

## 2015-08-11 DIAGNOSIS — Z79811 Long term (current) use of aromatase inhibitors: Secondary | ICD-10-CM

## 2015-08-11 DIAGNOSIS — Z79899 Other long term (current) drug therapy: Secondary | ICD-10-CM | POA: Diagnosis not present

## 2015-08-11 DIAGNOSIS — Z170 Estrogen receptor positive status [ER+]: Secondary | ICD-10-CM | POA: Diagnosis not present

## 2015-08-11 NOTE — Progress Notes (Signed)
Patient had a total hysterectomy on 06/25/2015.  Lexapro has been changed to Celexa to hot flashes.

## 2015-08-11 NOTE — Progress Notes (Signed)
[place]  Telephone:(336) [phone] Fax:(336) [phone]  ID: Carla Hunt OB: May 08, 1971  MR#: [id number]  [id number]  Patient Care Team: Idelle Crouch, MD as PCP - General (Internal Medicine)  CHIEF COMPLAINT:  Chief Complaint  Patient presents with  . DCIS    INTERVAL HISTORY: Patient returns to clinic for routine 6 month evaluation. She continues to tolerate Tamoxifen without significant side effects. She has some mild hot flashes intermittently.  She currently feels well and is asymptomatic.  She has no neurologic complaints.  She denies any fevers.  She has a good appetite and denies weight loss.  She has no chest pain or shortness of breath.  She denies any nausea, vomiting, constipation, or diarrhea.  She has no urinary complaints.  Patient offers no futher specific complaints today.  REVIEW OF SYSTEMS:   Review of Systems  Constitutional: Negative.  Negative for fever, weight loss and malaise/fatigue.  Respiratory: Negative.   Cardiovascular: Negative.  Negative for chest pain.  Gastrointestinal: Negative.   Genitourinary: Negative.   Musculoskeletal: Negative.   Neurological: Positive for sensory change. Negative for weakness.    As per HPI. Otherwise, a complete review of systems is negatve.  PAST MEDICAL HISTORY: IBS, migraines  PAST SURGICAL HISTORY: endometrial ablation, tonsillectomy, bilateral tubal ligation, total hysterectomy  FAMILY HISTORY: 4 paternal aunts with breast cancer, unclear ages or stage.  Patient also has family members on her maternal side with colon, lymphoma, and ovarian cancer.  Also, anemia, diabetes, hypertension, CVA     ADVANCED DIRECTIVES:    HEALTH MAINTENANCE: Social History  Substance Use Topics  . Smoking status: Never Smoker   . Smokeless tobacco: Not on file  . Alcohol Use: 0.6 oz/week    1 Glasses of wine per week     Comment: occassional     Colonoscopy:  PAP:  Bone density:  Lipid  panel:  No Known Allergies  Current Outpatient Prescriptions  Medication Sig Dispense Refill  . albuterol (PROAIR HFA) 108 (90 BASE) MCG/ACT inhaler     . clonazePAM (KLONOPIN) 0.5 MG tablet Take 1 tablet (0.5 mg total) by mouth 2 (two) times daily as needed for anxiety. 45 tablet 0  . docusate sodium (COLACE) 100 MG capsule Take 1 capsule (100 mg total) by mouth 2 (two) times daily. 10 capsule 0  . hyoscyamine (LEVSIN, ANASPAZ) 0.125 MG tablet Take 0.125 mg by mouth 3 (three) times daily as needed.     Marland Kitchen ibuprofen (ADVIL,MOTRIN) 800 MG tablet Take by mouth.    Marland Kitchen ibuprofen (ADVIL,MOTRIN) 800 MG tablet Take 1 tablet (800 mg total) by mouth every 8 (eight) hours as needed for moderate pain. 30 tablet 1  . tamoxifen (NOLVADEX) 20 MG tablet Take 1 tablet (20 mg total) by mouth daily. 30 tablet 1  . citalopram (CELEXA) 40 MG tablet     . escitalopram (LEXAPRO) 20 MG tablet Take 1 tablet (20 mg total) by mouth daily. (Patient not taking: Reported on 08/11/2015) 30 tablet 0  . lactulose (CHRONULAC) 10 GM/15ML solution Take 30 mLs (20 g total) by mouth 3 (three) times daily as needed for moderate constipation (Take until you have a bowel movement.). (Patient not taking: Reported on 08/11/2015) 240 mL 0  . oxyCODONE-acetaminophen (PERCOCET/ROXICET) 5-325 MG tablet Take 1-2 tablets by mouth every 4 (four) hours as needed (moderate to severe pain (when tolerating fluids)). (Patient not taking: Reported on 08/11/2015) 30 tablet 0  . topiramate (TOPAMAX) 50 MG tablet Take by mouth.  No current facility-administered medications for this visit.    OBJECTIVE: Filed Vitals:   08/11/15 1512  BP: 109/78  Pulse: 93  Temp: 99.4 F (37.4 C)  Resp: 16     Body mass index is 35.23 kg/(m^2).    ECOG FS:0 - Asymptomatic  General: Well-developed, well-nourished, no acute distress. Eyes: Pink conjunctiva, anicteric sclera. Breasts: Bilateral breast and axilla without lumps or masses. Lungs: Clear to  auscultation bilaterally. Heart: Regular rate and rhythm. No rubs, murmurs, or gallops. Abdomen: Soft, nontender, nondistended. No organomegaly noted, normoactive bowel sounds. Musculoskeletal: No edema, cyanosis, or clubbing. Neuro: Alert, answering all questions appropriately. Cranial nerves grossly intact. Skin: No rashes or petechiae noted. Psych: Normal affect.   LAB RESULTS:  Lab Results  Component Value Date   NA 138 06/26/2015   K 3.9 06/26/2015   CL 108 06/26/2015   CO2 24 06/26/2015   GLUCOSE 133* 06/26/2015   BUN 10 06/26/2015   CREATININE 0.63 06/26/2015   CALCIUM 8.3* 06/26/2015   GFRNONAA >60 06/26/2015   GFRAA >60 06/26/2015    Lab Results  Component Value Date   WBC 15.6* 06/26/2015   NEUTROABS 6.0 07/30/2014   HGB 11.5* 06/26/2015   HCT 34.0* 06/26/2015   MCV 90.0 06/26/2015   PLT 173 06/26/2015     STUDIES: No results found.  ASSESSMENT: DCIS  PLAN:    1.  DCIS: Since patient did not have an invasive component, adjuvant chemotherapy was not necessary.  Continue tamoxifen for total 5 years completing in March 2020. Her most recent mammogram on May 20, 2015 was reported as BI-RADS 2, repeat in November 2017. Return to clinic in 6 months for routine evaluation. 2. 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 Unfortunately there is no good screening for pancreatic cancer, but patient should have regular skin exams by a dermatologist.   Patient expressed understanding and was in agreement with this plan. She also understands that She can call clinic at any time with any questions, concerns, or complaints.   DCIS  (ductal carcinoma in situ)   Staging form: Breast, AJCC 7th Edition     Clinical stage from 02/13/2015: Stage 0 (Tis (DCIS), N0, M0) - Signed by Lloyd Huger, MD on 02/13/2015   Lloyd Huger, MD   08/11/2015 3:18 PM

## 2015-10-08 ENCOUNTER — Encounter: Payer: Self-pay | Admitting: Radiation Oncology

## 2015-10-08 ENCOUNTER — Ambulatory Visit
Admission: RE | Admit: 2015-10-08 | Discharge: 2015-10-08 | Disposition: A | Discharge status: Home / Self Care | Payer: BLUE CROSS/BLUE SHIELD | Source: Ambulatory Visit | Attending: Radiation Oncology | Admitting: Radiation Oncology

## 2015-10-08 VITALS — BP 140/89 | HR 102 | Temp 97.9°F | Resp 20 | Wt 189.4 lb

## 2015-10-08 DIAGNOSIS — Z853 Personal history of malignant neoplasm of breast: Secondary | ICD-10-CM

## 2015-10-08 NOTE — Progress Notes (Signed)
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 Grayland Ormond presenting for follow-up.  No acute issues - stable hot flashes related to endocrine therapy. No new breast or skin complaints. Mammogram in November 2016 was negative, BIRADS 2.   REVIEW OF SYSTEMS:   Review of Systems  Constitutional: Negative.  Negative for fever, weight loss and malaise/fatigue.  Respiratory: Negative.   Cardiovascular: Negative.  Negative for chest pain.  Gastrointestinal: Negative.   Genitourinary: Negative.   Musculoskeletal: Negative.   Neurological: Negative for weakness.   As per HPI. Otherwise, a complete review of systems is negatve.  PAST MEDICAL HISTORY: IBS, migraines  PAST SURGICAL HISTORY: endometrial ablation, tonsillectomy, bilateral tubal ligation, total hysterectomy  FAMILY HISTORY: 4 paternal aunts with breast cancer, unclear ages or stage.  Patient also has family members on her maternal side with colon, lymphoma, and ovarian cancer.  Also, anemia, diabetes, hypertension, CVA  KPS: 100 General: Well-developed, well-nourished, no acute distress. Eyes: Pink conjunctiva, anicteric sclera. Breasts: Bilateral breast and axilla without lumps or masses. Lungs: Clear to auscultation bilaterally. Heart: Regular rate and rhythm. No rubs, murmurs, or gallops. Neuro: Alert, answering all questions appropriately. Cranial nerves grossly intact. Skin: No rashes or petechiae noted. Psych: Normal affect.  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  No acute issues - negative mammogram November 2016 (recommend annual imaging). RTC in 1 year or sooner as needed. All questions answered.

## 2015-10-11 ENCOUNTER — Other Ambulatory Visit: Payer: Self-pay | Admitting: *Deleted

## 2015-10-11 MED ORDER — TAMOXIFEN CITRATE 20 MG PO TABS: 20.0000 mg | ORAL_TABLET | Freq: Every day | ORAL | Status: DC

## 2016-02-08 ENCOUNTER — Ambulatory Visit: Payer: BLUE CROSS/BLUE SHIELD | Admitting: Oncology

## 2016-02-13 NOTE — Progress Notes (Signed)
[place]  Telephone:(336) [phone] Fax:(336) [phone]  ID: Carla Hunt OB: Jul 10, 1971  MR#: [id number]  [id number]  Patient Care Team: Idelle Crouch, MD as PCP - General (Internal Medicine)  CHIEF COMPLAINT:  Right breast DCIS  INTERVAL HISTORY: Patient returns to clinic for routine 6 month evaluation. She continues to tolerate Tamoxifen without significant side effects. She does not complain of hot flashes today.  She currently feels well and is asymptomatic.  She has no neurologic complaints.  She denies any fevers.  She has a good appetite and denies weight loss.  She has no chest pain or shortness of breath.  She denies any nausea, vomiting, constipation, or diarrhea.  She has no urinary complaints.  Patient offers no specific complaints today.  REVIEW OF SYSTEMS:   Review of Systems  Constitutional: Negative.  Negative for fever, malaise/fatigue and weight loss.  Respiratory: Negative.   Cardiovascular: Negative.  Negative for chest pain.  Gastrointestinal: Negative.   Genitourinary: Negative.   Musculoskeletal: Negative.   Neurological: Negative.  Negative for sensory change and weakness.  Psychiatric/Behavioral: The patient has insomnia.     As per HPI. Otherwise, a complete review of systems is negatve.  PAST MEDICAL HISTORY: IBS, migraines  PAST SURGICAL HISTORY: endometrial ablation, tonsillectomy, bilateral tubal ligation, total hysterectomy  FAMILY HISTORY: 4 paternal aunts with breast cancer, unclear ages or stage.  Patient also has family members on her maternal side with colon, lymphoma, and ovarian cancer.  Also, anemia, diabetes, hypertension, CVA     ADVANCED DIRECTIVES:    HEALTH MAINTENANCE: Social History  Substance Use Topics  . Smoking status: Never Smoker  . Smokeless tobacco: Never Used  . Alcohol use 0.6 oz/week    1 Glasses of wine per week     Comment: occassional     Colonoscopy:  PAP:  Bone density:  Lipid  panel:  No Known Allergies  Current Outpatient Prescriptions  Medication Sig Dispense Refill  . citalopram (CELEXA) 40 MG tablet     . clonazePAM (KLONOPIN) 0.5 MG tablet Take 1 tablet (0.5 mg total) by mouth 2 (two) times daily as needed for anxiety. 45 tablet 0  . ibuprofen (ADVIL,MOTRIN) 800 MG tablet Take by mouth.    . tamoxifen (NOLVADEX) 20 MG tablet Take 1 tablet (20 mg total) by mouth daily. 30 tablet 6  . topiramate (TOPAMAX) 50 MG tablet     . albuterol (PROAIR HFA) 108 (90 BASE) MCG/ACT inhaler     . docusate sodium (COLACE) 100 MG capsule Take 1 capsule (100 mg total) by mouth 2 (two) times daily. (Patient not taking: Reported on 02/14/2016) 10 capsule 0   No current facility-administered medications for this visit.     OBJECTIVE: Vitals:   02/14/16 1457  BP: 123/86  Pulse: 97  Resp: 17  Temp: 97 F (36.1 C)     Body mass index is 35.02 kg/m.    ECOG FS:0 - Asymptomatic  General: Well-developed, well-nourished, no acute distress. Eyes: Pink conjunctiva, anicteric sclera. Breasts: Bilateral breast and axilla without lumps or masses. Lungs: Clear to auscultation bilaterally. Heart: Regular rate and rhythm. No rubs, murmurs, or gallops. Abdomen: Soft, nontender, nondistended. No organomegaly noted, normoactive bowel sounds. Musculoskeletal: No edema, cyanosis, or clubbing. Neuro: Alert, answering all questions appropriately. Cranial nerves grossly intact. Skin: No rashes or petechiae noted. Psych: Normal affect.   LAB RESULTS:  Lab Results  Component Value Date   NA 138 06/26/2015   K 3.9 06/26/2015  CL 108 06/26/2015   CO2 24 06/26/2015   GLUCOSE 133 (H) 06/26/2015   BUN 10 06/26/2015   CREATININE 0.63 06/26/2015   CALCIUM 8.3 (L) 06/26/2015   GFRNONAA >60 06/26/2015   GFRAA >60 06/26/2015    Lab Results  Component Value Date   WBC 15.6 (H) 06/26/2015   NEUTROABS 6.0 07/30/2014   HGB 11.5 (L) 06/26/2015   HCT 34.0 (L) 06/26/2015   MCV 90.0  06/26/2015   PLT 173 06/26/2015     STUDIES: No results found.  ASSESSMENT: Right breast DCIS  PLAN:    1.  Right breast DCIS: Since patient did not have an invasive component, adjuvant chemotherapy was not necessary.  Continue tamoxifen for total 5 years completing in March 2020. Her most recent mammogram on May 20, 2015 was reported as BI-RADS 2, repeat in November 2017. Return to clinic in 6 months for routine evaluation. 2. 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 Unfortunately there is no good screening for pancreatic cancer, but patient should have regular skin exams by a dermatologist.   Patient expressed understanding and was in agreement with this plan. She also understands that She can call clinic at any time with any questions, concerns, or complaints.   DCIS (ductal carcinoma in situ)   Staging form: Breast, AJCC 7th Edition     Clinical stage from 02/13/2015: Stage 0 (Tis (DCIS), N0, M0) - Signed by Lloyd Huger, MD on 02/13/2015   Lloyd Huger, MD   02/14/2016 3:27 PM

## 2016-02-14 ENCOUNTER — Encounter: Payer: Self-pay | Admitting: Oncology

## 2016-02-14 ENCOUNTER — Inpatient Hospital Stay: Payer: BLUE CROSS/BLUE SHIELD | Attending: Oncology | Admitting: Oncology

## 2016-02-14 VITALS — BP 123/86 | HR 97 | Temp 97.0°F | Resp 17 | Ht 62.0 in | Wt 191.5 lb

## 2016-02-14 DIAGNOSIS — Z79899 Other long term (current) drug therapy: Secondary | ICD-10-CM | POA: Insufficient documentation

## 2016-02-14 DIAGNOSIS — Z79810 Long term (current) use of selective estrogen receptor modulators (SERMs): Secondary | ICD-10-CM | POA: Diagnosis not present

## 2016-02-14 DIAGNOSIS — K589 Irritable bowel syndrome without diarrhea: Secondary | ICD-10-CM | POA: Insufficient documentation

## 2016-02-14 DIAGNOSIS — G43909 Migraine, unspecified, not intractable, without status migrainosus: Secondary | ICD-10-CM | POA: Diagnosis not present

## 2016-02-14 DIAGNOSIS — Z170 Estrogen receptor positive status [ER+]: Secondary | ICD-10-CM | POA: Insufficient documentation

## 2016-02-14 DIAGNOSIS — D0511 Intraductal carcinoma in situ of right breast: Secondary | ICD-10-CM

## 2016-02-14 NOTE — Progress Notes (Signed)
Pt no changes since last visit.  Last mammo was Nov 2016 accoding to pt.

## 2016-05-22 ENCOUNTER — Ambulatory Visit
Admission: RE | Admit: 2016-05-22 | Discharge: 2016-05-22 | Disposition: A | Discharge status: Home / Self Care | Payer: BLUE CROSS/BLUE SHIELD | Source: Ambulatory Visit | Attending: Oncology | Admitting: Oncology

## 2016-05-22 DIAGNOSIS — D0511 Intraductal carcinoma in situ of right breast: Secondary | ICD-10-CM

## 2016-06-15 ENCOUNTER — Other Ambulatory Visit: Payer: Self-pay | Admitting: *Deleted

## 2016-06-15 MED ORDER — TAMOXIFEN CITRATE 20 MG PO TABS: 20.0000 mg | ORAL_TABLET | Freq: Every day | ORAL | 6 refills | Status: AC

## 2016-07-27 ENCOUNTER — Emergency Department: Payer: BLUE CROSS/BLUE SHIELD

## 2016-07-27 ENCOUNTER — Encounter: Payer: Self-pay | Admitting: Emergency Medicine

## 2016-07-27 ENCOUNTER — Emergency Department
Admission: EM | Admit: 2016-07-27 | Discharge: 2016-07-27 | Disposition: A | Discharge status: Home / Self Care | Payer: BLUE CROSS/BLUE SHIELD | Attending: Emergency Medicine | Admitting: Emergency Medicine

## 2016-07-27 DIAGNOSIS — R1011 Right upper quadrant pain: Secondary | ICD-10-CM

## 2016-07-27 DIAGNOSIS — Z79899 Other long term (current) drug therapy: Secondary | ICD-10-CM | POA: Diagnosis not present

## 2016-07-27 DIAGNOSIS — Z853 Personal history of malignant neoplasm of breast: Secondary | ICD-10-CM | POA: Insufficient documentation

## 2016-07-27 DIAGNOSIS — K2900 Acute gastritis without bleeding: Secondary | ICD-10-CM | POA: Diagnosis not present

## 2016-07-27 DIAGNOSIS — R112 Nausea with vomiting, unspecified: Secondary | ICD-10-CM

## 2016-07-27 LAB — URINALYSIS, COMPLETE (UACMP) WITH MICROSCOPIC
Bilirubin Urine: NEGATIVE
Glucose, UA: NEGATIVE mg/dL
Hgb urine dipstick: NEGATIVE
Ketones, ur: 5 mg/dL — AB
Nitrite: NEGATIVE
PROTEIN: NEGATIVE mg/dL
Specific Gravity, Urine: 1.025 (ref 1.005–1.030)
pH: 6 (ref 5.0–8.0)

## 2016-07-27 LAB — CBC
HEMATOCRIT: 41.2 % (ref 35.0–47.0)
HEMOGLOBIN: 14.3 g/dL (ref 12.0–16.0)
MCH: 31.2 pg (ref 26.0–34.0)
MCHC: 34.8 g/dL (ref 32.0–36.0)
MCV: 89.8 fL (ref 80.0–100.0)
Platelets: 218 10*3/uL (ref 150–440)
RBC: 4.59 MIL/uL (ref 3.80–5.20)
RDW: 12.9 % (ref 11.5–14.5)
WBC: 10.8 10*3/uL (ref 3.6–11.0)

## 2016-07-27 LAB — COMPREHENSIVE METABOLIC PANEL
ALBUMIN: 4 g/dL (ref 3.5–5.0)
ALK PHOS: 74 U/L (ref 38–126)
ALT: 16 U/L (ref 14–54)
ANION GAP: 8 (ref 5–15)
AST: 20 U/L (ref 15–41)
BUN: 15 mg/dL (ref 6–20)
CALCIUM: 9.1 mg/dL (ref 8.9–10.3)
CO2: 23 mmol/L (ref 22–32)
Chloride: 109 mmol/L (ref 101–111)
Creatinine, Ser: 0.87 mg/dL (ref 0.44–1.00)
GFR calc Af Amer: >60 mL/min (ref >60)
GFR calc non Af Amer: >60 mL/min (ref >60)
GLUCOSE: 120 mg/dL — AB (ref 65–99)
Potassium: 4 mmol/L (ref 3.5–5.1)
SODIUM: 140 mmol/L (ref 135–145)
Total Bilirubin: 0.5 mg/dL (ref 0.3–1.2)
Total Protein: 7.3 g/dL (ref 6.5–8.1)

## 2016-07-27 LAB — LIPASE, BLOOD: Lipase: 15 U/L (ref 11–51)

## 2016-07-27 MED ORDER — ONDANSETRON 4 MG PO TBDP: 4.0000 mg | ORAL_TABLET | Freq: Three times a day (TID) | ORAL | 0 refills | Status: DC | PRN

## 2016-07-27 MED ORDER — RANITIDINE HCL 150 MG/10ML PO SYRP: 150.0000 mg | ORAL_SOLUTION | Freq: Once | ORAL | Status: DC

## 2016-07-27 MED ORDER — ONDANSETRON HCL 4 MG/2ML IJ SOLN
4.0000 mg | Freq: Once | INTRAMUSCULAR | Status: AC
  Administered 2016-07-27: 4 mg via INTRAVENOUS

## 2016-07-27 MED ORDER — RANITIDINE HCL 150 MG PO TABS: 150.0000 mg | ORAL_TABLET | Freq: Two times a day (BID) | ORAL | 0 refills | Status: DC

## 2016-07-27 MED ORDER — SODIUM CHLORIDE 0.9 % IV BOLUS (SEPSIS)
1000.0000 mL | Freq: Once | INTRAVENOUS | Status: AC
  Administered 2016-07-27: 1000 mL via INTRAVENOUS

## 2016-07-27 MED ORDER — ONDANSETRON HCL 4 MG/2ML IJ SOLN
INTRAMUSCULAR | Status: AC
  Administered 2016-07-27: 4 mg via INTRAVENOUS
  Filled 2016-07-27: qty 2

## 2016-07-27 NOTE — ED Provider Notes (Signed)
[facility] Emergency Department Provider Note  ____________________________________________  Time seen: Approximately 1:19 PM  I have reviewed the triage vital signs and the nursing notes.   HISTORY  Chief Complaint Abdominal Pain and Nausea    HPI Carla Hunt is a 46 y.o. female , NAD, presents in our department with several hour history of right upper quadrant abdominal pain. Patient states she woke this morning with sudden onset of right upper quadrant epigastric abdominal pain around 5 AM. Also notes nausea and acid reflux symptoms. States she took one of her antacids this morning without alleviation of symptoms. States that the pain can radiate into her back. Denies any chest pain, shortness of breath, diarrhea or changes in urinary patterns. No vaginal discharge or pelvic pain. Denies any fevers, chills, body aches, sweats but has had chills. No known exposures to anyone with similar symptoms. States she is established with a gastroenterologist who completes colonoscopies when they are due. Has a history of acid reflux that has otherwise been controlled.   Past Medical History:  Diagnosis Date  . Breast cancer ([place]) 2014   Radiation and Tamoxifen  . Cancer [facility]) 2014   Right breast  . IBS (irritable bowel syndrome)   . Migraine     Patient Active Problem List   Diagnosis Date Noted  . Postoperative state 06/25/2015  . Ductal carcinoma in situ (DCIS) of right breast 02/13/2015    Past Surgical History:  Procedure Laterality Date  . BREAST BIOPSY Right 2014   pos  . BREAST BIOPSY Right    neg  . CYSTOSCOPY  06/25/2015   Procedure: CYSTOSCOPY;  Surgeon: Benjaman Kindler, MD;  Location: [facility] ORS;  Service: Gynecology;;  . ENDOMETRIAL ABLATION    . ROBOTIC ASSISTED TOTAL HYSTERECTOMY N/A 06/25/2015   Procedure: ROBOTIC ASSISTED TOTAL HYSTERECTOMY/BSO/LYSIS OF ADHESIONS;  Surgeon: Benjaman Kindler, MD;  Location: [facility] ORS;  Service: Gynecology;   Laterality: N/A;  . TUBAL LIGATION      Prior to Admission medications   Medication Sig Start Date End Date Taking? Authorizing Provider  albuterol (PROAIR HFA) 108 (90 BASE) MCG/ACT inhaler  05/08/14   Historical Provider, MD  citalopram (CELEXA) 40 MG tablet  08/10/15   Historical Provider, MD  clonazePAM (KLONOPIN) 0.5 MG tablet Take 1 tablet (0.5 mg total) by mouth 2 (two) times daily as needed for anxiety. 01/29/15   Evlyn Kanner, NP  docusate sodium (COLACE) 100 MG capsule Take 1 capsule (100 mg total) by mouth 2 (two) times daily. Patient not taking: Reported on 02/14/2016 06/26/15   Benjaman Kindler, MD  ibuprofen (ADVIL,MOTRIN) 800 MG tablet Take by mouth.    Historical Provider, MD  ondansetron (ZOFRAN ODT) 4 MG disintegrating tablet Take 1 tablet (4 mg total) by mouth every 8 (eight) hours as needed for nausea or vomiting. 07/27/16   Jaely Silman L Mustafa Potts, PA-C  ranitidine (ZANTAC) 150 MG tablet Take 1 tablet (150 mg total) by mouth 2 (two) times daily. 07/27/16 08/26/16  Taelon Bendorf L Daniyla Pfahler, PA-C  tamoxifen (NOLVADEX) 20 MG tablet Take 1 tablet (20 mg total) by mouth daily. 06/15/16   Lloyd Huger, MD  topiramate (TOPAMAX) 50 MG tablet  09/11/15   Historical Provider, MD    Allergies Patient has no known allergies.  Family History  Problem Relation Age of Onset  . Breast cancer Paternal Aunt 78     5 aunts 60-70 yrs old  . Cancer Cousin 81    Colon    Social  History Social History  Substance Use Topics  . Smoking status: Never Smoker  . Smokeless tobacco: Never Used  . Alcohol use 0.6 oz/week    1 Glasses of wine per week     Comment: occassional     Review of Systems  Constitutional: Positive chills. No fever, fatigue, diaphoresis Eyes: No visual changes.  ENT: No sore throat. Cardiovascular: No chest pain or palpitations. Respiratory: No shortness of breath. No wheezing.  Gastrointestinal: Positive epigastric and right upper quadrant abdominal pain with nausea but no  vomiting.   No diarrhea.  No constipation. Genitourinary: Negative for dysuria, hematuria, vaginal discharge, pelvic pain. No urinary hesitancy, urgency or increased frequency. Musculoskeletal: Negative for back , neck pain. No extremity pain. Skin: Negative for rash. Neurological: Negative for headaches, focal weakness or numbness. No tingling. 10-point ROS otherwise negative.  ____________________________________________   PHYSICAL EXAM:  VITAL SIGNS: ED Triage Vitals  Enc Vitals Group     BP 07/27/16 1109 121/78     Pulse Rate 07/27/16 1109 (!) 110     Resp 07/27/16 1109 18     Temp 07/27/16 1109 98.2 F (36.8 C)     Temp Source 07/27/16 1109 Oral     SpO2 07/27/16 1109 98 %     Weight 07/27/16 1109 172 lb (78 kg)     Height 07/27/16 1109 5\' 2"  (1.575 m)     Head Circumference --      Peak Flow --      Pain Score 07/27/16 1110 10     Pain Loc --      Pain Edu? --      Excl. in [place]? --      Constitutional: Alert and oriented. Well appearing and in no acute distress. Eyes: Conjunctivae are normal Without icterus or injection. Head: Atraumatic. Neck: Supple with full range of motion. Hematological/Lymphatic/Immunilogical: No cervical lymphadenopathy. Cardiovascular: Normal rate, regular rhythm. Normal S1 and S2.  Good peripheral circulation. Respiratory: Normal respiratory effort without tachypnea or retractions. Lungs CTAB with breath sounds noted in all lung fields. No wheeze, rhonchi, rales. Gastrointestinal: Tenderness to palpation about the epigastric and right upper quadrant without distention. Mild guarding of the right upper quadrant. No rebound. No rigidity. Bowel sounds are present and normoactive in all quadrants. No masses, hepatosplenomegaly. Negative Rovsing's and Psoas sign. Musculoskeletal: No lower extremity tenderness nor edema.  No joint effusions. Neurologic:  Normal speech and language. No gross focal neurologic deficits are appreciated.  Skin:  Skin is  warm, dry and intact. No rash, redness, swelling, skin sores, bruising noted. Psychiatric: Mood and affect are normal. Speech and behavior are normal. Patient exhibits appropriate insight and judgement.   ____________________________________________   LABS (all labs ordered are listed, but only abnormal results are displayed)  Labs Reviewed  COMPREHENSIVE METABOLIC PANEL - Abnormal; Notable for the following:       Result Value   Glucose, Bld 120 (*)    All other components within normal limits  URINALYSIS, COMPLETE (UACMP) WITH MICROSCOPIC - Abnormal; Notable for the following:    Color, Urine YELLOW (*)    APPearance CLEAR (*)    Ketones, ur 5 (*)    Leukocytes, UA TRACE (*)    Bacteria, UA RARE (*)    Squamous Epithelial / LPF 0-5 (*)    All other components within normal limits  URINE CULTURE  LIPASE, BLOOD  CBC   ____________________________________________  EKG  EKG reveals sinus tachycardia with a ventricular rate of 106 bpm.  No acute changes or evidence of STEMI. EKG also reviewed by Dr. Meade Maw ____________________________________________  RADIOLOGY I, Braxton Feathers, personally viewed and evaluated these images (plain radiographs) as part of my medical decision making, as well as reviewing the written report by the radiologist.  US Abdomen Limited Ruq  Result Date: 07/27/2016 CLINICAL DATA:  Right upper quadrant pain. EXAM: US ABDOMEN LIMITED - RIGHT UPPER QUADRANT COMPARISON:  04/17/2012 . FINDINGS: Gallbladder: No gallstones or wall thickening visualized. No sonographic Murphy sign noted by sonographer. Common bile duct: Diameter: 3.1 mm Liver: There is echogenic consistent fatty infiltration and/or hepatocellular disease. No focal hepatic abnormality identified. IMPRESSION: 1. Liver is echogenic consistent with fatty infiltration and/or hepatocellular disease. No focal hepatic abnormality identified. 2.  No gallstones.  No biliary distention . Electronically  Signed   By: Marcello Moores  Register   On: 07/27/2016 15:40    ____________________________________________    PROCEDURES  Procedure(s) performed: None   Procedures   Medications  ondansetron (ZOFRAN) injection 4 mg (not administered)  sodium chloride 0.9 % bolus 1,000 mL (0 mLs Intravenous Stopped 07/27/16 1607)  ondansetron (ZOFRAN) injection 4 mg (4 mg Intravenous Given 07/27/16 1351)     ____________________________________________   INITIAL IMPRESSION / ASSESSMENT AND PLAN / ED COURSE  Pertinent labs & imaging results that were available during my care of the patient were reviewed by me and considered in my medical decision making (see chart for details).  Clinical Course     Patient's diagnosis is consistent with Right upper quadrant abdominal pain, nausea and vomiting, gastritis. Laboratory and imaging results were reassuring. Since the patient is established with a GI physician I feel it is best for her to follow up with them outpatient for further evaluation. Discussed with the patient that her symptoms could be the beginning of a gastrointestinal viral illness and to continue to orally hydrate and monitor her diet. Patient will be discharged home with prescriptions for Zofran ODT and ranitidine to take as directed. Patient is to follow up with Dr. Tiffany Kocher in gastroenterology if symptoms persist past this treatment course. Patient and her husband at the bedside is given ED precautions to return to the ED for any worsening or new symptoms.    ____________________________________________  FINAL CLINICAL IMPRESSION(S) / ED DIAGNOSES  Final diagnoses:  RUQ abdominal pain  Nausea & vomiting  Acute gastritis without hemorrhage, unspecified gastritis type      NEW MEDICATIONS STARTED DURING THIS VISIT:  New Prescriptions   ONDANSETRON (ZOFRAN ODT) 4 MG DISINTEGRATING TABLET    Take 1 tablet (4 mg total) by mouth every 8 (eight) hours as needed for nausea or vomiting.    RANITIDINE (ZANTAC) 150 MG TABLET    Take 1 tablet (150 mg total) by mouth 2 (two) times daily.         Braxton Feathers, PA-C 07/27/16 [place], MD 07/31/16 2118

## 2016-07-27 NOTE — ED Triage Notes (Signed)
Pt comes into the ED via POV c/o RUQ abdominal pain that radiates into her back.  Patient is having N/V that is accompanying the chief complaint.  Patient in NAD at this time with even and unlabored respirations.  Denies any chest pain, shortness of breath, or dizziness.  Patient explains that the pain started around 5:00 this morning.

## 2016-07-27 NOTE — ED Notes (Signed)
See triage note  States she woke up with epigastric pain this am     Pain moves into back   Pos. N/v   States she took zantac this am w/o relief

## 2016-07-27 NOTE — Discharge Instructions (Signed)
Please see your GI specialist to follow up on your symptoms.

## 2016-07-28 LAB — URINE CULTURE
Culture: <10000 — AB
SPECIAL REQUESTS: NORMAL

## 2016-07-31 ENCOUNTER — Other Ambulatory Visit: Payer: Self-pay | Admitting: Nurse Practitioner

## 2016-07-31 DIAGNOSIS — R1011 Right upper quadrant pain: Secondary | ICD-10-CM | POA: Insufficient documentation

## 2016-07-31 DIAGNOSIS — R1319 Other dysphagia: Secondary | ICD-10-CM | POA: Insufficient documentation

## 2016-07-31 DIAGNOSIS — K219 Gastro-esophageal reflux disease without esophagitis: Secondary | ICD-10-CM | POA: Insufficient documentation

## 2016-08-08 ENCOUNTER — Encounter: Payer: Self-pay | Admitting: Radiology

## 2016-08-08 ENCOUNTER — Encounter
Admission: RE | Admit: 2016-08-08 | Discharge: 2016-08-08 | Disposition: A | Discharge status: Home / Self Care | Payer: BLUE CROSS/BLUE SHIELD | Source: Ambulatory Visit | Attending: Nurse Practitioner | Admitting: Nurse Practitioner

## 2016-08-08 DIAGNOSIS — R1011 Right upper quadrant pain: Secondary | ICD-10-CM | POA: Insufficient documentation

## 2016-08-08 MED ORDER — TECHNETIUM TC 99M MEBROFENIN IV KIT
4.9000 | PACK | Freq: Once | INTRAVENOUS | Status: AC | PRN
  Administered 2016-08-08: 4.9 via INTRAVENOUS

## 2016-08-16 ENCOUNTER — Inpatient Hospital Stay: Payer: BLUE CROSS/BLUE SHIELD | Admitting: Oncology

## 2016-08-21 NOTE — Progress Notes (Signed)
[place]  [phone]) [phone] [phone]) [phone]  ID: Carla Hunt OB: [birth year]  MR#: [id number]  [id number]  Patient Care Team: Idelle Crouch, MD as PCP - General (Internal Medicine)  CHIEF COMPLAINT:  Right breast DCIS  INTERVAL HISTORY: Patient returns to clinic for routine 6 month evaluation. She continues to tolerate Tamoxifen without significant side effects. She does not complain of hot flashes today.  She has intermittent abdominal pain and will require a cholecystectomy in the near future. She otherwise feels well and is asymptomatic.  She has no neurologic complaints.  She denies any fevers.  She has a good appetite and denies weight loss.  She has no chest pain or shortness of breath.  She denies any nausea, vomiting, constipation, or diarrhea.  She has no urinary complaints.  Patient offers no further specific complaints today.  REVIEW OF SYSTEMS:   Review of Systems  Constitutional: Negative.  Negative for fever, malaise/fatigue and weight loss.  Respiratory: Negative.  Negative for cough and shortness of breath.   Cardiovascular: Negative.  Negative for chest pain and leg swelling.  Gastrointestinal: Positive for abdominal pain.  Genitourinary: Negative.   Musculoskeletal: Negative.   Neurological: Negative.  Negative for sensory change and weakness.  Psychiatric/Behavioral: The patient has insomnia. The patient is not nervous/anxious.     As per HPI. Otherwise, a complete review of systems is negative.  PAST MEDICAL HISTORY: IBS, migraines  PAST SURGICAL HISTORY: endometrial ablation, tonsillectomy, bilateral tubal ligation, total hysterectomy  FAMILY HISTORY: 4 paternal aunts with breast cancer, unclear ages or stage.  Patient also has family members on her maternal side with colon, lymphoma, and ovarian cancer.  Also, anemia, diabetes, hypertension, CVA     ADVANCED DIRECTIVES:    HEALTH MAINTENANCE: Social History    Substance Use Topics  . Smoking status: Never Smoker  . Smokeless tobacco: Never Used  . Alcohol use 0.6 oz/week    1 Glasses of wine per week     Comment: occassional     Colonoscopy:  PAP:  Bone density:  Lipid panel:  No Known Allergies  Current Outpatient Prescriptions  Medication Sig Dispense Refill  . albuterol (PROAIR HFA) 108 (90 BASE) MCG/ACT inhaler Inhale 2 puffs into the lungs every 6 (six) hours as needed for wheezing or shortness of breath.     . citalopram (CELEXA) 40 MG tablet Take 40 mg by mouth every morning.     . clonazePAM (KLONOPIN) 0.5 MG tablet Take 1 tablet (0.5 mg total) by mouth 2 (two) times daily as needed for anxiety. (Patient taking differently: Take 0.5 mg by mouth at bedtime as needed (sleep). ) 45 tablet 0  . ibuprofen (ADVIL,MOTRIN) 200 MG tablet Take 800 mg by mouth every 6 (six) hours as needed for headache or mild pain.    Marland Kitchen ondansetron (ZOFRAN ODT) 4 MG disintegrating tablet Take 1 tablet (4 mg total) by mouth every 8 (eight) hours as needed for nausea or vomiting. 20 tablet 0  . pantoprazole (PROTONIX) 40 MG tablet Take 40 mg by mouth every morning.     . phentermine (ADIPEX-P) 37.5 MG tablet Take 37.5 mg by mouth daily before breakfast.     . topiramate (TOPAMAX) 50 MG tablet Take 100 mg by mouth every morning.     . docusate sodium (COLACE) 100 MG capsule Take 1 capsule (100 mg total) by mouth 2 (two) times daily. (Patient not taking: Reported on 08/18/2016) 10 capsule 0  . ranitidine (ZANTAC) 150  MG tablet Take 1 tablet (150 mg total) by mouth 2 (two) times daily. (Patient not taking: Reported on 08/18/2016) 60 tablet 0  . tamoxifen (NOLVADEX) 20 MG tablet Take 1 tablet (20 mg total) by mouth daily. 30 tablet 6   No current facility-administered medications for this visit.     OBJECTIVE: Vitals:   08/22/16 1546  BP: 124/88  Pulse: 96  Resp: 18  Temp: 97.1 F (36.2 C)     Body mass index is 30.48 kg/m.    ECOG FS:0 -  Asymptomatic  General: Well-developed, well-nourished, no acute distress. Eyes: Pink conjunctiva, anicteric sclera. Breasts: Bilateral breast and axilla without lumps or masses. Lungs: Clear to auscultation bilaterally. Heart: Regular rate and rhythm. No rubs, murmurs, or gallops. Abdomen: Soft, nontender, nondistended. No organomegaly noted, normoactive bowel sounds. Musculoskeletal: No edema, cyanosis, or clubbing. Neuro: Alert, answering all questions appropriately. Cranial nerves grossly intact. Skin: No rashes or petechiae noted. Psych: Normal affect.   LAB RESULTS:  Lab Results  Component Value Date   NA 140 07/27/2016   K 4.0 07/27/2016   CL 109 07/27/2016   CO2 23 07/27/2016   GLUCOSE 120 (H) 07/27/2016   BUN 15 07/27/2016   CREATININE 0.87 07/27/2016   CALCIUM 9.1 07/27/2016   PROT 7.3 07/27/2016   ALBUMIN 4.0 07/27/2016   AST 20 07/27/2016   ALT 16 07/27/2016   ALKPHOS 74 07/27/2016   BILITOT 0.5 07/27/2016   GFRNONAA >60 07/27/2016   GFRAA >60 07/27/2016    Lab Results  Component Value Date   WBC 10.8 07/27/2016   NEUTROABS 6.0 07/30/2014   HGB 14.3 07/27/2016   HCT 41.2 07/27/2016   MCV 89.8 07/27/2016   PLT 218 07/27/2016     STUDIES: Nm Hepato W/eject Fract  Result Date: 08/08/2016 CLINICAL DATA:  Two weeks of right upper quadrant abdominal pain associated with nausea. EXAM: NUCLEAR MEDICINE HEPATOBILIARY IMAGING WITH GALLBLADDER EF TECHNIQUE: Sequential images of the abdomen were obtained out to 60 minutes following intravenous administration of radiopharmaceutical. After oral ingestion of Ensure, gallbladder ejection fraction was determined. At 60 min, normal ejection fraction is greater than 33%. RADIOPHARMACEUTICALS:  4.9 mCi Tc-22m  Choletec IV COMPARISON:  Abdominal ultrasound of July 27, 2016 FINDINGS: Prompt uptake and biliary excretion of activity by the liver is seen. Gallbladder activity is visualized, consistent with patency of cystic  duct. Biliary activity passes into small bowel, consistent with patent common bile duct. Calculated gallbladder ejection fraction is 32%%. (Normal gallbladder ejection fraction with Ensure is greater than 33%.) the patient experience abdominal discomfort after Ensure ingestion. IMPRESSION: Decreased ejection fraction consistent with gallbladder dysfunction. No evidence of cystic duct or common bile duct obstruction. Electronically Signed   By: David  Martinique M.D.   On: 08/08/2016 10:11   US Abdomen Limited Ruq  Result Date: 07/27/2016 CLINICAL DATA:  Right upper quadrant pain. EXAM: US ABDOMEN LIMITED - RIGHT UPPER QUADRANT COMPARISON:  04/17/2012 . FINDINGS: Gallbladder: No gallstones or wall thickening visualized. No sonographic Murphy sign noted by sonographer. Common bile duct: Diameter: 3.1 mm Liver: There is echogenic consistent fatty infiltration and/or hepatocellular disease. No focal hepatic abnormality identified. IMPRESSION: 1. Liver is echogenic consistent with fatty infiltration and/or hepatocellular disease. No focal hepatic abnormality identified. 2.  No gallstones.  No biliary distention . Electronically Signed   By: Marcello Moores  Register   On: 07/27/2016 15:40    ASSESSMENT: Right breast DCIS  PLAN:    1.  Right breast DCIS: Since patient did  not have an invasive component, adjuvant chemotherapy was not necessary.  Continue tamoxifen for total 5 years completing in March 2020. Her most recent mammogram on May 22, 2016 was reported as BI-RADS 2, repeat in November 2018. Return to clinic in 6 months for routine evaluation. 2. 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 Unfortunately there is no good screening for pancreatic cancer, but patient should have regular skin exams by a dermatologist.  3. Abdominal pain: Proceed with cholecystectomy as scheduled.  Patient expressed understanding and was in agreement with this plan. She also understands that She can call clinic at any time with any questions, concerns, or complaints.   DCIS (ductal carcinoma in situ)   Staging form: Breast, AJCC 7th Edition     Clinical stage from 02/13/2015: Stage 0 (Tis (DCIS), N0, M0) - Signed by Lloyd Huger, MD on 02/13/2015   Lloyd Huger, MD   08/22/2016 4:05 PM

## 2016-08-22 ENCOUNTER — Encounter
Admission: RE | Admit: 2016-08-22 | Discharge: 2016-08-22 | Disposition: A | Discharge status: Home / Self Care | Payer: BLUE CROSS/BLUE SHIELD | Source: Ambulatory Visit | Attending: Surgery | Admitting: Surgery

## 2016-08-22 ENCOUNTER — Inpatient Hospital Stay: Payer: BLUE CROSS/BLUE SHIELD | Attending: Oncology | Admitting: Oncology

## 2016-08-22 VITALS — BP 124/88 | HR 96 | Temp 97.1°F | Resp 18 | Wt 172.1 lb

## 2016-08-22 DIAGNOSIS — Z79810 Long term (current) use of selective estrogen receptor modulators (SERMs): Secondary | ICD-10-CM

## 2016-08-22 DIAGNOSIS — Z170 Estrogen receptor positive status [ER+]: Secondary | ICD-10-CM | POA: Diagnosis not present

## 2016-08-22 DIAGNOSIS — D0511 Intraductal carcinoma in situ of right breast: Secondary | ICD-10-CM | POA: Diagnosis not present

## 2016-08-22 DIAGNOSIS — R109 Unspecified abdominal pain: Secondary | ICD-10-CM | POA: Insufficient documentation

## 2016-08-22 HISTORY — DX: Gastro-esophageal reflux disease without esophagitis: K21.9

## 2016-08-22 HISTORY — DX: Personal history of other diseases of the respiratory system: Z87.09

## 2016-08-22 HISTORY — DX: Anxiety disorder, unspecified: F41.9

## 2016-08-22 HISTORY — DX: Family history of other specified conditions: Z84.89

## 2016-08-22 NOTE — Patient Instructions (Signed)
  Your procedure is scheduled on: 08-25-16 Report to Same Day Surgery 2nd floor medical mall [facility] Entrance-take elevator on left to 2nd floor.  Check in with surgery information desk.) To find out your arrival time please call [phone] between 1PM - 3PM on 08-24-16  Remember: Instructions that are not followed completely may result in serious medical risk, up to and including death, or upon the discretion of your surgeon and anesthesiologist your surgery may need to be rescheduled.    _x___ 1. Do not eat food or drink liquids after midnight. No gum chewing or hard candies.     __x__ 2. No Alcohol for 24 hours before or after surgery.   __x__3. No Smoking for 24 prior to surgery.   ____  4. Bring all medications with you on the day of surgery if instructed.    __x__ 5. Notify your doctor if there is any change in your medical condition     (cold, fever, infections).     Do not wear jewelry, make-up, hairpins, clips or nail polish.  Do not wear lotions, powders, or perfumes. You may wear deodorant.  Do not shave 48 hours prior to surgery. Men may shave face and neck.  Do not bring valuables to the hospital.    [facility] is not responsible for any belongings or valuables.               Contacts, dentures or bridgework may not be worn into surgery.  Leave your suitcase in the car. After surgery it may be brought to your room.  For patients admitted to the hospital, discharge time is determined by your treatment team.   Patients discharged the day of surgery will not be allowed to drive home.  You will need someone to drive you home and stay with you the night of your procedure.    Please read over the following fact sheets that you were given:   [facility] Preparing for Surgery and or MRSA Information   _x___ Take these medicines the morning of surgery with A SIP OF WATER:    1. LEXAPRO  2. TOPAMAX  3. PROTONIX  4. TAKE AN EXTRA PROTONIX ON Thursday  NIGHT  5.  6.  ____Fleets enema or Magnesium Citrate as directed.   ____ Use CHG Soap or sage wipes as directed on instruction sheet   _X___ Use inhalers on the day of surgery and bring to hospital day of surgery-BRING ALBUTEROL Regino Ramirez  ____ Stop metformin 2 days prior to surgery    ____ Take 1/2 of usual insulin dose the night before surgery and none on the morning of  surgery.   ____ Stop Aspirin, Coumadin, Pllavix ,Eliquis, Effient, or Pradaxa  x__ Stop Anti-inflammatories such as Advil, Aleve, Ibuprofen, Motrin, Naproxen,          Naprosyn, Goodies powders or aspirin products NOW- Ok to take Tylenol.   _X___ Stop supplements until after surgery-STOP PHENTERMINE NOW  ____ Bring C-Pap to the hospital.

## 2016-08-22 NOTE — Progress Notes (Signed)
Patient is here for follow up, she is doing well 

## 2016-08-25 ENCOUNTER — Encounter: Payer: Self-pay | Admitting: *Deleted

## 2016-08-25 ENCOUNTER — Ambulatory Visit: Payer: BLUE CROSS/BLUE SHIELD

## 2016-08-25 ENCOUNTER — Encounter
Admission: RE | Disposition: A | Discharge status: Home / Self Care | Payer: Self-pay | Source: Ambulatory Visit | Attending: Surgery

## 2016-08-25 ENCOUNTER — Ambulatory Visit
Admission: RE | Admit: 2016-08-25 | Discharge: 2016-08-25 | Disposition: A | Discharge status: Home / Self Care | Payer: BLUE CROSS/BLUE SHIELD | Source: Ambulatory Visit | Attending: Surgery | Admitting: Surgery

## 2016-08-25 ENCOUNTER — Ambulatory Visit: Payer: BLUE CROSS/BLUE SHIELD | Admitting: Anesthesiology

## 2016-08-25 DIAGNOSIS — G43909 Migraine, unspecified, not intractable, without status migrainosus: Secondary | ICD-10-CM | POA: Diagnosis not present

## 2016-08-25 DIAGNOSIS — Q438 Other specified congenital malformations of intestine: Secondary | ICD-10-CM | POA: Insufficient documentation

## 2016-08-25 DIAGNOSIS — Z853 Personal history of malignant neoplasm of breast: Secondary | ICD-10-CM | POA: Diagnosis not present

## 2016-08-25 DIAGNOSIS — Z79899 Other long term (current) drug therapy: Secondary | ICD-10-CM | POA: Insufficient documentation

## 2016-08-25 DIAGNOSIS — K589 Irritable bowel syndrome without diarrhea: Secondary | ICD-10-CM | POA: Diagnosis not present

## 2016-08-25 DIAGNOSIS — F419 Anxiety disorder, unspecified: Secondary | ICD-10-CM | POA: Insufficient documentation

## 2016-08-25 DIAGNOSIS — Z90710 Acquired absence of both cervix and uterus: Secondary | ICD-10-CM | POA: Insufficient documentation

## 2016-08-25 DIAGNOSIS — Z6830 Body mass index (BMI) 30.0-30.9, adult: Secondary | ICD-10-CM | POA: Insufficient documentation

## 2016-08-25 DIAGNOSIS — E669 Obesity, unspecified: Secondary | ICD-10-CM | POA: Diagnosis not present

## 2016-08-25 DIAGNOSIS — K811 Chronic cholecystitis: Secondary | ICD-10-CM | POA: Insufficient documentation

## 2016-08-25 DIAGNOSIS — Z419 Encounter for procedure for purposes other than remedying health state, unspecified: Secondary | ICD-10-CM

## 2016-08-25 HISTORY — PX: CHOLECYSTECTOMY: SHX55

## 2016-08-25 SURGERY — LAPAROSCOPIC CHOLECYSTECTOMY WITH INTRAOPERATIVE CHOLANGIOGRAM
Anesthesia: General | Site: Abdomen | Wound class: Clean Contaminated

## 2016-08-25 MED ORDER — ROCURONIUM BROMIDE 100 MG/10ML IV SOLN
INTRAVENOUS | Status: DC | PRN
  Administered 2016-08-25: 30 mg via INTRAVENOUS

## 2016-08-25 MED ORDER — SUCCINYLCHOLINE CHLORIDE 200 MG/10ML IV SOSY
PREFILLED_SYRINGE | INTRAVENOUS | Status: AC
  Filled 2016-08-25: qty 10

## 2016-08-25 MED ORDER — ONDANSETRON HCL 4 MG/2ML IJ SOLN
INTRAMUSCULAR | Status: AC
  Filled 2016-08-25: qty 2

## 2016-08-25 MED ORDER — LACTATED RINGERS IV SOLN
INTRAVENOUS | Status: DC
  Administered 2016-08-25 (×2): via INTRAVENOUS

## 2016-08-25 MED ORDER — HYDROCODONE-ACETAMINOPHEN 5-325 MG PO TABS
ORAL_TABLET | ORAL | Status: AC
  Filled 2016-08-25: qty 1

## 2016-08-25 MED ORDER — DEXAMETHASONE SODIUM PHOSPHATE 10 MG/ML IJ SOLN
INTRAMUSCULAR | Status: AC
  Filled 2016-08-25: qty 1

## 2016-08-25 MED ORDER — FENTANYL CITRATE (PF) 100 MCG/2ML IJ SOLN
25.0000 ug | INTRAMUSCULAR | Status: DC | PRN
  Administered 2016-08-25 (×4): 25 ug via INTRAVENOUS

## 2016-08-25 MED ORDER — FENTANYL CITRATE (PF) 100 MCG/2ML IJ SOLN
INTRAMUSCULAR | Status: AC
  Filled 2016-08-25: qty 2

## 2016-08-25 MED ORDER — OXYCODONE HCL 5 MG PO TABS: 5.0000 mg | ORAL_TABLET | Freq: Once | ORAL | Status: DC | PRN

## 2016-08-25 MED ORDER — DEXAMETHASONE SODIUM PHOSPHATE 10 MG/ML IJ SOLN
INTRAMUSCULAR | Status: DC | PRN
  Administered 2016-08-25: 8 mg via INTRAVENOUS

## 2016-08-25 MED ORDER — PROPOFOL 10 MG/ML IV BOLUS
INTRAVENOUS | Status: DC | PRN
  Administered 2016-08-25: 160 mg via INTRAVENOUS

## 2016-08-25 MED ORDER — ROCURONIUM BROMIDE 50 MG/5ML IV SOLN
INTRAVENOUS | Status: AC
  Filled 2016-08-25: qty 1

## 2016-08-25 MED ORDER — HYDROCODONE-ACETAMINOPHEN 5-325 MG PO TABS
1.0000 | ORAL_TABLET | ORAL | Status: DC | PRN
  Administered 2016-08-25: 1 via ORAL

## 2016-08-25 MED ORDER — HYDROCODONE-ACETAMINOPHEN 5-325 MG PO TABS: 1.0000 | ORAL_TABLET | ORAL | 0 refills | Status: DC | PRN

## 2016-08-25 MED ORDER — ONDANSETRON HCL 4 MG/2ML IJ SOLN
INTRAMUSCULAR | Status: DC | PRN
  Administered 2016-08-25: 4 mg via INTRAVENOUS

## 2016-08-25 MED ORDER — OXYCODONE HCL 5 MG/5ML PO SOLN: 5.0000 mg | Freq: Once | ORAL | Status: DC | PRN

## 2016-08-25 MED ORDER — HEPARIN SODIUM (PORCINE) 5000 UNIT/ML IJ SOLN
INTRAMUSCULAR | Status: AC
  Filled 2016-08-25: qty 1

## 2016-08-25 MED ORDER — LIDOCAINE HCL (CARDIAC) 20 MG/ML IV SOLN
INTRAVENOUS | Status: DC | PRN
  Administered 2016-08-25: 40 mg via INTRAVENOUS

## 2016-08-25 MED ORDER — LIDOCAINE HCL (PF) 2 % IJ SOLN
INTRAMUSCULAR | Status: AC
  Filled 2016-08-25: qty 2

## 2016-08-25 MED ORDER — SODIUM CHLORIDE 0.9 % IJ SOLN
INTRAMUSCULAR | Status: AC
  Filled 2016-08-25: qty 10

## 2016-08-25 MED ORDER — MIDAZOLAM HCL 2 MG/2ML IJ SOLN
INTRAMUSCULAR | Status: DC | PRN
  Administered 2016-08-25: 2 mg via INTRAVENOUS

## 2016-08-25 MED ORDER — MIDAZOLAM HCL 2 MG/2ML IJ SOLN
INTRAMUSCULAR | Status: AC
  Filled 2016-08-25: qty 2

## 2016-08-25 MED ORDER — PROPOFOL 10 MG/ML IV BOLUS
INTRAVENOUS | Status: AC
  Filled 2016-08-25: qty 20

## 2016-08-25 MED ORDER — SODIUM CHLORIDE 0.9 % IJ SOLN
INTRAMUSCULAR | Status: AC
  Filled 2016-08-25: qty 50

## 2016-08-25 MED ORDER — FENTANYL CITRATE (PF) 100 MCG/2ML IJ SOLN
INTRAMUSCULAR | Status: AC
  Administered 2016-08-25: 25 ug via INTRAVENOUS
  Filled 2016-08-25: qty 2

## 2016-08-25 MED ORDER — SUGAMMADEX SODIUM 200 MG/2ML IV SOLN
INTRAVENOUS | Status: DC | PRN
  Administered 2016-08-25: 156 mg via INTRAVENOUS

## 2016-08-25 MED ORDER — SODIUM CHLORIDE 0.9 % IV SOLN
INTRAVENOUS | Status: DC | PRN
  Administered 2016-08-25: 200 mL via INTRAMUSCULAR

## 2016-08-25 MED ORDER — FENTANYL CITRATE (PF) 100 MCG/2ML IJ SOLN
INTRAMUSCULAR | Status: DC | PRN
  Administered 2016-08-25 (×4): 50 ug via INTRAVENOUS

## 2016-08-25 SURGICAL SUPPLY — 39 items
APPLIER CLIP ROT 10 11.4 M/L (STAPLE) ×3
CANISTER SUCT 1200ML W/VALVE (MISCELLANEOUS) ×3 IMPLANT
CANNULA DILATOR 10 W/SLV (CANNULA) ×2 IMPLANT
CANNULA DILATOR 10MM W/SLV (CANNULA) ×1
CATH REDDICK CHOLANGI 4FR 50CM (CATHETERS) ×3 IMPLANT
CHLORAPREP W/TINT 26ML (MISCELLANEOUS) ×3 IMPLANT
CLIP APPLIE ROT 10 11.4 M/L (STAPLE) ×1 IMPLANT
CLOSURE WOUND 1/2 X4 (GAUZE/BANDAGES/DRESSINGS) ×1
DERMABOND ADVANCED (GAUZE/BANDAGES/DRESSINGS) ×2
DERMABOND ADVANCED .7 DNX12 (GAUZE/BANDAGES/DRESSINGS) ×1 IMPLANT
DRAPE SHEET LG 3/4 BI-LAMINATE (DRAPES) ×3 IMPLANT
ELECT REM PT RETURN 9FT ADLT (ELECTROSURGICAL) ×3
ELECTRODE REM PT RTRN 9FT ADLT (ELECTROSURGICAL) ×1 IMPLANT
GAUZE SPONGE 4X4 12PLY STRL (GAUZE/BANDAGES/DRESSINGS) ×6 IMPLANT
GLOVE BIO SURGEON STRL SZ7.5 (GLOVE) ×3 IMPLANT
GOWN STRL REUS W/ TWL LRG LVL3 (GOWN DISPOSABLE) ×4 IMPLANT
GOWN STRL REUS W/TWL LRG LVL3 (GOWN DISPOSABLE) ×8
IRRIGATION STRYKERFLOW (MISCELLANEOUS) ×1 IMPLANT
IRRIGATOR STRYKERFLOW (MISCELLANEOUS) ×3
IV NS 1000ML (IV SOLUTION) ×2
IV NS 1000ML BAXH (IV SOLUTION) ×1 IMPLANT
KIT RM TURNOVER STRD PROC AR (KITS) ×3 IMPLANT
LABEL OR SOLS (LABEL) ×3 IMPLANT
NDL INSUFF ACCESS 14 VERSASTEP (NEEDLE) ×3 IMPLANT
NEEDLE FILTER BLUNT 18X 1/2SAF (NEEDLE) ×2
NEEDLE FILTER BLUNT 18X1 1/2 (NEEDLE) ×1 IMPLANT
NS IRRIG 500ML POUR BTL (IV SOLUTION) ×3 IMPLANT
PACK LAP CHOLECYSTECTOMY (MISCELLANEOUS) ×3 IMPLANT
SCISSORS METZENBAUM CVD 33 (INSTRUMENTS) ×3 IMPLANT
SEAL FOR SCOPE WARMER C3101 (MISCELLANEOUS) ×3 IMPLANT
SLEEVE ENDOPATH XCEL 5M (ENDOMECHANICALS) ×3 IMPLANT
STRIP CLOSURE SKIN 1/2X4 (GAUZE/BANDAGES/DRESSINGS) ×2 IMPLANT
SUT CHROMIC 5 0 RB 1 27 (SUTURE) ×3 IMPLANT
SUT VIC AB 0 CT2 27 (SUTURE) IMPLANT
SYR 3ML LL SCALE MARK (SYRINGE) ×3 IMPLANT
TROCAR XCEL NON-BLD 11X100MML (ENDOMECHANICALS) ×3 IMPLANT
TROCAR XCEL NON-BLD 5MMX100MML (ENDOMECHANICALS) ×3 IMPLANT
TUBING INSUFFLATOR HI FLOW (MISCELLANEOUS) ×3 IMPLANT
WATER STERILE IRR 1000ML POUR (IV SOLUTION) ×3 IMPLANT

## 2016-08-25 NOTE — H&P (Signed)
  She comes in today prepared for laparoscopic cholecystectomy. She has had some minor right upper quadrant abdominal pain since the office visit. She reports no other change in overall condition.   Lab work reviewed  I discussed the plan for laparoscopic cholecystectomy

## 2016-08-25 NOTE — Anesthesia Preprocedure Evaluation (Signed)
Anesthesia Evaluation  Patient identified by MRN, date of birth, ID band Patient awake    Reviewed: Allergy & Precautions, H&P , NPO status , Patient's Chart, lab work & pertinent test results  History of Anesthesia Complications (+) Family history of anesthesia reaction and history of anesthetic complications  Airway Mallampati: II  TM Distance: >3 FB Neck ROM: full    Dental  (+) Poor Dentition, Chipped   Pulmonary neg shortness of breath, asthma ,    Pulmonary exam normal breath sounds clear to auscultation       Cardiovascular Exercise Tolerance: Good (-) angina(-) Past MI and (-) DOE negative cardio ROS Normal cardiovascular exam Rhythm:regular Rate:Normal     Neuro/Psych  Headaches, PSYCHIATRIC DISORDERS Anxiety    GI/Hepatic Neg liver ROS, GERD  Controlled,  Endo/Other  negative endocrine ROS  Renal/GU      Musculoskeletal   Abdominal   Peds  Hematology negative hematology ROS (+)   Anesthesia Other Findings Past Medical History: No date: Anxiety 2014: Breast cancer ([place])     Comment: Radiation and Tamoxifen 2014: Cancer ([place])     Comment: Right breast No date: Family history of adverse reaction to anesthes*     Comment: MOM-N/V No date: GERD (gastroesophageal reflux disease) No date: H/O upper respiratory infection     Comment: PT HAS ALBUTEROL INHALER PRN FOR THIS  No date: IBS (irritable bowel syndrome) No date: Migraine  Past Surgical History: 2014: BREAST BIOPSY Right     Comment: pos-PT STATES NO LYMPH NODES WERE REMOVED SO               RIGHT ARM CAN BE USED No date: BREAST BIOPSY Right     Comment: neg 06/25/2015: CYSTOSCOPY     Comment: Procedure: CYSTOSCOPY;  Surgeon: Benjaman Kindler, MD;  Location: [facility] ORS;  Service:               Gynecology;; No date: ENDOMETRIAL ABLATION 06/25/2015: ROBOTIC ASSISTED TOTAL HYSTERECTOMY N/A     Comment: Procedure: ROBOTIC ASSISTED  TOTAL               HYSTERECTOMY/BSO/LYSIS OF ADHESIONS;  Surgeon:               Benjaman Kindler, MD;  Location: [facility] ORS;                Service: Gynecology;  Laterality: N/A; No date: TUBAL LIGATION     Reproductive/Obstetrics negative OB ROS                             Anesthesia Physical Anesthesia Plan  ASA: III  Anesthesia Plan: General ETT   Post-op Pain Management:    Induction:   Airway Management Planned:   Additional Equipment:   Intra-op Plan:   Post-operative Plan:   Informed Consent: I have reviewed the patients History and Physical, chart, labs and discussed the procedure including the risks, benefits and alternatives for the proposed anesthesia with the patient or authorized representative who has indicated his/her understanding and acceptance.   Dental Advisory Given  Plan Discussed with: Anesthesiologist, CRNA and Surgeon  Anesthesia Plan Comments:         Anesthesia Quick Evaluation

## 2016-08-25 NOTE — Op Note (Signed)
OPERATIVE REPORT  PREOPERATIVE DIAGNOSIS:  Chronic cholecystitis   POSTOPERATIVE DIAGNOSIS: Chronic cholecystitis   PROCEDURE: Laparoscopic cholecystectomy   ANESTHESIA: General  SURGEON: Rochel Brome M.D.  INDICATIONS: She has history of epigastric pains and abnormally low gallbladder ejection fraction and surgery was recommended for definitive treatment.    With the patient on the operating table in the supine position under general endotracheal anesthesia the abdomen was prepared with ChloraPrep solution and draped in a sterile manner. A short incision was made in the inferior aspect of the umbilicus and carried down to the deep fascia which was grasped with a laryngeal hook. A Veress needle was inserted aspirated and irrigated with a saline solution. The peritoneal cavity was insufflated with carbon dioxide. The Veress needle was removed. The 10 mm cannula was inserted. The 10 mm 0 laparoscope was inserted to view the peritoneal cavity.  Another incision was made in the epigastrium slightly to the right of the midline to introduce an 11 mm cannula. 2 incisions were made in the lateral aspect of the right upper quadrant to introduce 2   5 mm cannulas. Initial inspection revealed that there were some adhesions surrounding the gallbladder. The liver appeared to have a smooth surface. Multiple adhesions were divided with blunt and sharp dissection and use of electrocautery exposing the gallbladder. The gallbladder was retracted towards the right shoulder.  The gallbladder neck was retracted inferiorly and laterally.  The porta hepatis was identified. The gallbladder was mobilized with incision of the visceral peritoneum. The cystic duct was dissected free from surrounding structures. The cystic artery was dissected free from surrounding structures. A critical view of safety was demonstrated  An Endo Clip was placed across the cystic duct adjacent to the gallbladder neck. An incision was made in the  cystic duct to introduce a Reddick catheter. However the Reddick catheter would not thread into the cystic duct and therefore  was removed. A cholangiogram was not done due to the small size of the cystic duct. The cystic duct was doubly ligated with endoclips and divided. The cystic artery was controlled with endoclips and divided. The gallbladder was dissected free from the liver with use of hook and cautery and blunt dissection. Bleeding was minimal and hemostasis was intact. The gallbladder was delivered up through the infraumbilical incision opened and suctioned. The gallbladder had the appearance of cholesterolosis. It was submitted in formalin for routine pathology. The right upper quadrant was further inspected and could see hemostasis was intact. The cannulas were removed allowing carbon dioxide to escape from the peritoneal cavity The skin incisions were closed with interrupted 5-0 chromic subcuticular suture benzoin and Steri-Strips. Gauze dressings were applied with paper tape.  The patient appeared to be in satisfactory condition and was prepared for transfer to the recovery room  [place].D.

## 2016-08-25 NOTE — Anesthesia Post-op Follow-up Note (Cosign Needed)
Anesthesia QCDR form completed.        

## 2016-08-25 NOTE — Transfer of Care (Signed)
Immediate Anesthesia Transfer of Care Note  Patient: Carla Hunt  Procedure(s) Performed: Procedure(s): LAPAROSCOPIC CHOLECYSTECTOMY WITH INTRAOPERATIVE CHOLANGIOGRAM (N/A)  Patient Location: PACU  Anesthesia Type:General  Level of Consciousness: awake  Airway & Oxygen Therapy: Patient Spontanous Breathing and Patient connected to face mask oxygen  Post-op Assessment: Report given to RN and Post -op Vital signs reviewed and stable  Post vital signs: Reviewed and stable  Last Vitals:  Vitals:   08/25/16 1322 08/25/16 1740  BP: 138/82 117/81  Pulse: (!) 103 74  Resp: 16 14  Temp: 36.8 C 36.2 C    Last Pain:  Vitals:   08/25/16 1322  TempSrc: Oral  PainSc: 2          Complications: No apparent anesthesia complications

## 2016-08-25 NOTE — Anesthesia Procedure Notes (Signed)
Procedure Name: Intubation Date/Time: 08/25/2016 4:10 PM Performed by: Allean Found Pre-anesthesia Checklist: Patient identified, Emergency Drugs available, Suction available, Patient being monitored and Timeout performed Patient Re-evaluated:Patient Re-evaluated prior to inductionOxygen Delivery Method: Circle system utilized Preoxygenation: Pre-oxygenation with 100% oxygen Intubation Type: IV induction Ventilation: Mask ventilation without difficulty Laryngoscope Size: McGraph and 3 Grade View: Grade II Tube type: Oral Number of attempts: 1 Airway Equipment and Method: Stylet Placement Confirmation: ETT inserted through vocal cords under direct vision,  positive ETCO2 and breath sounds checked- equal and bilateral Secured at: 21 cm Tube secured with: Tape Dental Injury: Teeth and Oropharynx as per pre-operative assessment

## 2016-08-25 NOTE — Discharge Instructions (Signed)
Take Tylenol or Norco if needed for pain.  Should not drive or do anything dangerous while taking Norco.  Remove dressings on Saturday. May shower Sunday.  Avoid straining and heavy lifting during the first week after surgery.  AMBULATORY SURGERY  DISCHARGE INSTRUCTIONS   1) The drugs that you were given will stay in your system until tomorrow so for the next 24 hours you should not:  A) Drive an automobile B) Make any legal decisions C) Drink any alcoholic beverage   2) You may resume regular meals tomorrow.  Today it is better to start with liquids and gradually work up to solid foods.  You may eat anything you prefer, but it is better to start with liquids, then soup and crackers, and gradually work up to solid foods.   3) Please notify your doctor immediately if you have any unusual bleeding, trouble breathing, redness and pain at the surgery site, drainage, fever, or pain not relieved by medication.    4) Additional Instructions:        Please contact your physician with any problems or Same Day Surgery at [phone], Monday through Friday 6 am to 4 pm, or [place] at [facility] number at [phone].

## 2016-08-26 NOTE — Anesthesia Postprocedure Evaluation (Signed)
Anesthesia Post Note  Patient: SHAQUONDA STELMA  Procedure(s) Performed: Procedure(s) (LRB): LAPAROSCOPIC CHOLECYSTECTOMY WITH INTRAOPERATIVE CHOLANGIOGRAM (N/A)  Patient location during evaluation: PACU Anesthesia Type: General Level of consciousness: awake and alert Pain management: pain level controlled Vital Signs Assessment: post-procedure vital signs reviewed and stable Respiratory status: spontaneous breathing, nonlabored ventilation, respiratory function stable and patient connected to nasal cannula oxygen Cardiovascular status: blood pressure returned to baseline and stable Postop Assessment: no signs of nausea or vomiting Anesthetic complications: no     Last Vitals:  Vitals:   08/25/16 1851 08/25/16 1859  BP: 117/88 118/84  Pulse: 91 91  Resp: 16 16  Temp: 36.8 C     Last Pain:  Vitals:   08/25/16 1859  TempSrc:   PainSc: Vaughn Taja [place]

## 2016-08-28 ENCOUNTER — Encounter: Payer: Self-pay | Admitting: Surgery

## 2016-08-29 LAB — SURGICAL PATHOLOGY

## 2016-09-08 ENCOUNTER — Encounter: Payer: Self-pay | Admitting: *Deleted

## 2016-10-09 ENCOUNTER — Encounter: Payer: Self-pay | Admitting: Radiation Oncology

## 2016-10-09 ENCOUNTER — Ambulatory Visit: Payer: BLUE CROSS/BLUE SHIELD | Admitting: Radiation Oncology

## 2016-10-09 ENCOUNTER — Ambulatory Visit
Admission: RE | Admit: 2016-10-09 | Discharge: 2016-10-09 | Disposition: A | Discharge status: Home / Self Care | Payer: BLUE CROSS/BLUE SHIELD | Source: Ambulatory Visit | Attending: Radiation Oncology | Admitting: Radiation Oncology

## 2016-10-09 VITALS — BP 135/92 | HR 91 | Temp 97.5°F | Resp 18 | Wt 172.4 lb

## 2016-10-09 DIAGNOSIS — D0511 Intraductal carcinoma in situ of right breast: Secondary | ICD-10-CM

## 2016-10-09 NOTE — Progress Notes (Signed)
Radiation Oncology Follow up Note  Name: Carla Hunt   Date:   10/09/2016 MRN:  [id number] DOB: [birth year]    This 46 y.o. female presents to the clinic today for a 3 year follow-up for radiation therapy to her right breast for ductal carcinoma in situ.  REFERRING PROVIDER: Idelle Crouch, MD  HPI: Patient is a 46 year old female now out 3 years having completed radiation therapy to her right breast for ductal carcinoma in situ. Tumor was ER/PR positive. She seen today in routine follow up is doing well. She's been having some slight itching of her left breast. No rash or lesions are noted.. She had mammograms back in November 2017 BI-RADS 2 benign she has been having some nipple discharge although that that has stopped. She is also currently on tamoxifen tolerating tolerating that well without side effect. She specifically denies breast tenderness cough or bone pain.  COMPLICATIONS OF TREATMENT: none  FOLLOW UP COMPLIANCE: keeps appointments   PHYSICAL EXAM:  BP (!) 135/92   Pulse 91   Temp 97.5 F (36.4 C)   Resp 18   Wt 172 lb 6.4 oz (78.2 kg)   LMP 06/24/2005   BMI 30.54 kg/m  Lungs are clear to A&P cardiac examination essentially unremarkable with regular rate and rhythm. No dominant mass or nodularity is noted in either breast in 2 positions examined. Incision is well-healed. No axillary or supraclavicular adenopathy is appreciated. Cosmetic result is excellent. Can see no visible lesions on her left breast. Well-developed well-nourished patient in NAD. HEENT reveals PERLA, EOMI, discs not visualized.  Oral cavity is clear. No oral mucosal lesions are identified. Neck is clear without evidence of cervical or supraclavicular adenopathy. Lungs are clear to A&P. Cardiac examination is essentially unremarkable with regular rate and rhythm without murmur rub or thrill. Abdomen is benign with no organomegaly or masses noted. Motor sensory and DTR levels are equal and symmetric in the  upper and lower extremities. Cranial nerves II through XII are grossly intact. Proprioception is intact. No peripheral adenopathy or edema is identified. No motor or sensory levels are noted. Crude visual fields are within normal range.  RADIOLOGY RESULTS: Mammograms are reviewed and compatible with the above-stated findings  PLAN: Present time of asked her start using some antifungal cream such as Lotrimin on her left breast possible fungal infection. Do not see anything clinically apparent examination. She otherwise has no evidence of disease. I'm please were overall progress. She continues on tamoxifen without side effect. She's ordered and scheduled follow-up mammograms next year. She knows to call with any concerns.  I would like to take this opportunity to thank you for allowing me to participate in the care of your patient.Armstead Peaks., MD

## 2016-10-13 ENCOUNTER — Ambulatory Visit: Payer: BLUE CROSS/BLUE SHIELD | Admitting: Radiation Oncology

## 2017-02-19 ENCOUNTER — Encounter: Payer: Self-pay | Admitting: Oncology

## 2017-02-19 ENCOUNTER — Ambulatory Visit: Payer: BLUE CROSS/BLUE SHIELD | Admitting: Oncology

## 2017-02-19 ENCOUNTER — Inpatient Hospital Stay: Payer: BLUE CROSS/BLUE SHIELD | Attending: Oncology | Admitting: Oncology

## 2017-02-19 VITALS — BP 129/85 | HR 103 | Temp 97.4°F | Wt 166.9 lb

## 2017-02-19 DIAGNOSIS — Z90710 Acquired absence of both cervix and uterus: Secondary | ICD-10-CM | POA: Diagnosis not present

## 2017-02-19 DIAGNOSIS — D0511 Intraductal carcinoma in situ of right breast: Secondary | ICD-10-CM | POA: Diagnosis present

## 2017-02-19 DIAGNOSIS — Z79810 Long term (current) use of selective estrogen receptor modulators (SERMs): Secondary | ICD-10-CM | POA: Diagnosis not present

## 2017-02-19 DIAGNOSIS — E669 Obesity, unspecified: Secondary | ICD-10-CM | POA: Insufficient documentation

## 2017-02-19 DIAGNOSIS — K589 Irritable bowel syndrome without diarrhea: Secondary | ICD-10-CM | POA: Insufficient documentation

## 2017-02-19 DIAGNOSIS — Q438 Other specified congenital malformations of intestine: Secondary | ICD-10-CM | POA: Insufficient documentation

## 2017-02-19 DIAGNOSIS — Z79899 Other long term (current) drug therapy: Secondary | ICD-10-CM | POA: Diagnosis not present

## 2017-02-19 DIAGNOSIS — R1031 Right lower quadrant pain: Secondary | ICD-10-CM | POA: Insufficient documentation

## 2017-02-19 DIAGNOSIS — Z8669 Personal history of other diseases of the nervous system and sense organs: Secondary | ICD-10-CM | POA: Insufficient documentation

## 2017-02-19 DIAGNOSIS — Z170 Estrogen receptor positive status [ER+]: Secondary | ICD-10-CM | POA: Diagnosis not present

## 2017-02-19 DIAGNOSIS — IMO0002 Reserved for concepts with insufficient information to code with codable children: Secondary | ICD-10-CM | POA: Insufficient documentation

## 2017-02-19 DIAGNOSIS — J309 Allergic rhinitis, unspecified: Secondary | ICD-10-CM | POA: Insufficient documentation

## 2017-02-19 DIAGNOSIS — Z9049 Acquired absence of other specified parts of digestive tract: Secondary | ICD-10-CM | POA: Insufficient documentation

## 2017-02-19 NOTE — Progress Notes (Signed)
Carla Hunt  [phone] [phone]) [phone]  ID: Philmore Pali OB: 1971/02/28  MR#: [id number]  PIR#:[id number]  Patient Care Team: Idelle Crouch, MD as PCP - General (Internal Medicine)  CHIEF COMPLAINT:  Right breast DCIS  INTERVAL HISTORY: Patient returns to clinic for routine 6 month evaluation. She continues to tolerate Tamoxifen without significant side effects. She continues to complain of hot flashes but they are better since she has been taking Celexa. Her abdominal pain has resolved. She is status post cholecystectomy in February 2018. She otherwise feels well and is asymptomatic.  She has no neurologic complaints.  She denies any fevers.  She has a good appetite and denies weight loss.  She has no chest pain or shortness of breath.  She denies any nausea, vomiting, constipation, or diarrhea.  She has no urinary complaints.  Patient offers no further specific complaints today.  REVIEW OF SYSTEMS:   Review of Systems  Constitutional: Negative.  Negative for fever, malaise/fatigue and weight loss.  Respiratory: Negative.  Negative for cough and shortness of breath.   Cardiovascular: Negative.  Negative for chest pain and leg swelling.  Genitourinary: Negative.   Musculoskeletal: Negative.   Neurological: Negative.  Negative for sensory change and weakness.  Psychiatric/Behavioral: The patient has insomnia. The patient is not nervous/anxious.     As per HPI. Otherwise, a complete review of systems is negative.  PAST MEDICAL HISTORY: IBS, migraines  PAST SURGICAL HISTORY: endometrial ablation, tonsillectomy, bilateral tubal ligation, total hysterectomy  FAMILY HISTORY: 4 paternal aunts with breast cancer, unclear ages or stage.  Patient also has family members on her maternal side with colon, lymphoma, and ovarian cancer.  Also, anemia, diabetes, hypertension, CVA     ADVANCED DIRECTIVES:    HEALTH MAINTENANCE: Social History  Substance  Use Topics  . Smoking status: Never Smoker  . Smokeless tobacco: Never Used  . Alcohol use 0.6 oz/week    1 Glasses of wine per week     Comment: occassional     Colonoscopy:  PAP:  Bone density:  Lipid panel:  No Known Allergies  Current Outpatient Prescriptions  Medication Sig Dispense Refill  . albuterol (PROAIR HFA) 108 (90 BASE) MCG/ACT inhaler Inhale 2 puffs into the lungs every 6 (six) hours as needed for wheezing or shortness of breath.     . citalopram (CELEXA) 40 MG tablet Take 40 mg by mouth every morning.     . clonazePAM (KLONOPIN) 0.5 MG tablet Take 1 tablet (0.5 mg total) by mouth 2 (two) times daily as needed for anxiety. (Patient taking differently: Take 0.5 mg by mouth at bedtime as needed (sleep). ) 45 tablet 0  . docusate sodium (COLACE) 100 MG capsule Take 1 capsule (100 mg total) by mouth 2 (two) times daily. (Patient not taking: Reported on 08/18/2016) 10 capsule 0  . HYDROcodone-acetaminophen (NORCO) 5-325 MG tablet Take 1-2 tablets by mouth every 4 (four) hours as needed for moderate pain. (Patient not taking: Reported on 10/09/2016) 12 tablet 0  . ibuprofen (ADVIL,MOTRIN) 200 MG tablet Take 800 mg by mouth every 6 (six) hours as needed for headache or mild pain.    Marland Kitchen ondansetron (ZOFRAN ODT) 4 MG disintegrating tablet Take 1 tablet (4 mg total) by mouth every 8 (eight) hours as needed for nausea or vomiting. 20 tablet 0  . pantoprazole (PROTONIX) 40 MG tablet Take 40 mg by mouth every morning.     . phentermine (ADIPEX-P) 37.5 MG tablet Take 37.5 mg  by mouth daily before breakfast.     . ranitidine (ZANTAC) 150 MG tablet Take 1 tablet (150 mg total) by mouth 2 (two) times daily. (Patient not taking: Reported on 08/18/2016) 60 tablet 0  . tamoxifen (NOLVADEX) 20 MG tablet Take 1 tablet (20 mg total) by mouth daily. 30 tablet 6  . topiramate (TOPAMAX) 50 MG tablet Take 100 mg by mouth every morning.      No current facility-administered medications for this visit.      OBJECTIVE: There were no vitals filed for this visit.   There is no height or weight on file to calculate BMI.    ECOG FS:0 - Asymptomatic  General: Well-developed, well-nourished, no acute distress. Eyes: Pink conjunctiva, anicteric sclera. Breasts: Bilateral breast and axilla without lumps or masses. Lungs: Clear to auscultation bilaterally. Heart: Regular rate and rhythm. No rubs, murmurs, or gallops. Abdomen: Soft, nontender, nondistended. No organomegaly noted, normoactive bowel sounds. Musculoskeletal: No edema, cyanosis, or clubbing. Neuro: Alert, answering all questions appropriately. Cranial nerves grossly intact. Skin: No rashes or petechiae noted. Psych: Normal affect.   LAB RESULTS:  Lab Results  Component Value Date   NA 140 07/27/2016   K 4.0 07/27/2016   CL 109 07/27/2016   CO2 23 07/27/2016   GLUCOSE 120 (H) 07/27/2016   BUN 15 07/27/2016   CREATININE 0.87 07/27/2016   CALCIUM 9.1 07/27/2016   PROT 7.3 07/27/2016   ALBUMIN 4.0 07/27/2016   AST 20 07/27/2016   ALT 16 07/27/2016   ALKPHOS 74 07/27/2016   BILITOT 0.5 07/27/2016   GFRNONAA >60 07/27/2016   GFRAA >60 07/27/2016    Lab Results  Component Value Date   WBC 10.8 07/27/2016   NEUTROABS 6.0 07/30/2014   HGB 14.3 07/27/2016   HCT 41.2 07/27/2016   MCV 89.8 07/27/2016   PLT 218 07/27/2016     STUDIES: No results found.  ASSESSMENT: Right breast DCIS  PLAN:    1.  Right breast DCIS: Since patient did not have an invasive component, adjuvant chemotherapy was not necessary.  Continue tamoxifen for total 5 years completing in March 2020. Her most recent mammogram on May 22, 2016 was reported as BI-RADS 2, repeat in November 2018. Order placed. Return to clinic in 6 months for routine evaluation. 2. 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 There  is no known risk factor for breast cancer associated with this gene, but a mutation in CDK4 is known for melanoma cancer syndrome putting patient at increased risk for melanoma and pancreatic cancer. Unfortunately there is no good screening for pancreatic cancer, but patient should have regular skin exams by a dermatologist.  3. Abdominal pain: Resolved. Cholecystectomy February 2018.  Patient expressed understanding and was in agreement with this plan. She also understands that She can call clinic at any time with any questions, concerns, or complaints.   DCIS (ductal carcinoma in situ)   Staging form: Breast, AJCC 7th Edition     Clinical stage from 02/13/2015: Stage 0 (Tis (DCIS), N0, M0) - Signed by Lloyd Huger, MD on 02/13/2015   Jacquelin Hawking, NP   02/19/2017 8:38 AM

## 2017-05-23 ENCOUNTER — Ambulatory Visit
Admission: RE | Admit: 2017-05-23 | Discharge: 2017-05-23 | Disposition: A | Discharge status: Home / Self Care | Payer: BLUE CROSS/BLUE SHIELD | Source: Ambulatory Visit | Attending: Oncology | Admitting: Oncology

## 2017-05-23 DIAGNOSIS — D0511 Intraductal carcinoma in situ of right breast: Secondary | ICD-10-CM

## 2017-05-23 HISTORY — DX: Personal history of irradiation: Z92.3

## 2017-08-23 ENCOUNTER — Inpatient Hospital Stay: Payer: BLUE CROSS/BLUE SHIELD | Admitting: Oncology

## 2017-08-29 ENCOUNTER — Inpatient Hospital Stay: Payer: BLUE CROSS/BLUE SHIELD | Attending: Oncology | Admitting: Oncology

## 2017-08-29 ENCOUNTER — Encounter: Payer: Self-pay | Admitting: Oncology

## 2017-08-29 VITALS — BP 137/88 | HR 87

## 2017-08-29 DIAGNOSIS — D0511 Intraductal carcinoma in situ of right breast: Secondary | ICD-10-CM | POA: Diagnosis not present

## 2017-08-29 DIAGNOSIS — K625 Hemorrhage of anus and rectum: Secondary | ICD-10-CM | POA: Insufficient documentation

## 2017-08-29 DIAGNOSIS — K648 Other hemorrhoids: Secondary | ICD-10-CM | POA: Insufficient documentation

## 2017-08-29 DIAGNOSIS — Z79810 Long term (current) use of selective estrogen receptor modulators (SERMs): Secondary | ICD-10-CM | POA: Diagnosis not present

## 2017-08-29 DIAGNOSIS — Z90710 Acquired absence of both cervix and uterus: Secondary | ICD-10-CM | POA: Diagnosis not present

## 2017-08-29 NOTE — Progress Notes (Signed)
Carla Hunt  [phone]) [phone] [phone]) [phone]  ID: Philmore Pali OB: 24-Aug-1970  MR#: [id number]  DVV#:[id number]  Patient Care Team: Idelle Crouch, MD as PCP - General (Internal Medicine)  CHIEF COMPLAINT:  Right breast DCIS  INTERVAL HISTORY: Patient returns to clinic for routine 6 month follow-up. She continues to tolerate tamoxifen well without side effects. She continues her Celexa that has completely eliminated her hot flashes. She has occasional abdominal pain due to IBS and has noticed recently mild bleeding from her rectum. She states she is due for a colonoscopy in early 2020. Endorses internal hemorrhoids. She admits to occasional painful bowel movements. This is typically associated with constipation. Otherwise she feels well and is asymptomatic.Most recent mammogram is from November 2018 which was BI-RADS 2. She denies shortness of breath or chest pain, nausea or vomiting, constipation or diarrhea. She has no urinary complaints  Patient's sister was recently diagnosed with left breast DCIS and is seeing Dr. Rogue Bussing for treatment. Her sister is 11 years old.  REVIEW OF SYSTEMS:   Review of Systems  Constitutional: Negative.  Negative for fever, malaise/fatigue and weight loss.  Respiratory: Negative.  Negative for cough and shortness of breath.   Cardiovascular: Negative.  Negative for chest pain and leg swelling.  Gastrointestinal: Positive for blood in stool (small amounts of bright red blood). Negative for abdominal pain, nausea and vomiting.       Occasional painful bowel movements- internal hemorrhoids  Genitourinary: Negative.   Musculoskeletal: Negative.   Neurological: Negative.  Negative for dizziness, sensory change, weakness and headaches.  Endo/Heme/Allergies: Negative.   Psychiatric/Behavioral: The patient is not nervous/anxious and does not have insomnia.     As per HPI. Otherwise, a complete review of systems is  negative.  PAST MEDICAL HISTORY: IBS, migraines  PAST SURGICAL HISTORY: endometrial ablation, tonsillectomy, bilateral tubal ligation, total hysterectomy  FAMILY HISTORY: 4 paternal aunts with breast cancer, unclear ages or stage.  Patient also has family members on her maternal side with colon, lymphoma, and ovarian cancer.  Also, anemia, diabetes, hypertension, CVA     ADVANCED DIRECTIVES:    HEALTH MAINTENANCE: Social History   Tobacco Use  . Smoking status: Never Smoker  . Smokeless tobacco: Never Used  Substance Use Topics  . Alcohol use: Yes    Alcohol/week: 0.6 oz    Types: 1 Glasses of wine per week    Comment: occassional  . Drug use: No     Colonoscopy:  PAP:  Bone density:  Lipid panel:  No Known Allergies  Current Outpatient Medications  Medication Sig Dispense Refill  . albuterol (PROAIR HFA) 108 (90 BASE) MCG/ACT inhaler Inhale 2 puffs into the lungs every 6 (six) hours as needed for wheezing or shortness of breath.     . citalopram (CELEXA) 40 MG tablet Take 40 mg by mouth every morning.     Marland Kitchen ibuprofen (ADVIL,MOTRIN) 200 MG tablet Take 800 mg by mouth every 6 (six) hours as needed for headache or mild pain.    . pantoprazole (PROTONIX) 40 MG tablet Take 40 mg by mouth every morning.     . phentermine (ADIPEX-P) 37.5 MG tablet Take 37.5 mg by mouth daily before breakfast.     . tamoxifen (NOLVADEX) 20 MG tablet Take 1 tablet (20 mg total) by mouth daily. 30 tablet 6  . topiramate (TOPAMAX) 50 MG tablet Take 100 mg by mouth every morning.      No current facility-administered medications for this visit.  OBJECTIVE: Vitals:   08/29/17 1548  BP: 137/88  Pulse: 87     There is no height or weight on file to calculate BMI.    ECOG FS:0 - Asymptomatic  General: Well-developed, well-nourished, no acute distress. Eyes: Pink conjunctiva, anicteric sclera. Breasts: Bilateral breast and axilla without lumps or masses. Lungs: Clear to auscultation  bilaterally. Heart: Regular rate and rhythm. No rubs, murmurs, or gallops. Abdomen: Soft, nontender, nondistended. No organomegaly noted, normoactive bowel sounds. Musculoskeletal: No edema, cyanosis, or clubbing. Neuro: Alert, answering all questions appropriately. Cranial nerves grossly intact. Skin: No rashes or petechiae noted. Psych: Normal affect.   LAB RESULTS:  Lab Results  Component Value Date   NA 140 07/27/2016   K 4.0 07/27/2016   CL 109 07/27/2016   CO2 23 07/27/2016   GLUCOSE 120 (H) 07/27/2016   BUN 15 07/27/2016   CREATININE 0.87 07/27/2016   CALCIUM 9.1 07/27/2016   PROT 7.3 07/27/2016   ALBUMIN 4.0 07/27/2016   AST 20 07/27/2016   ALT 16 07/27/2016   ALKPHOS 74 07/27/2016   BILITOT 0.5 07/27/2016   GFRNONAA >60 07/27/2016   GFRAA >60 07/27/2016    Lab Results  Component Value Date   WBC 10.8 07/27/2016   NEUTROABS 6.0 07/30/2014   HGB 14.3 07/27/2016   HCT 41.2 07/27/2016   MCV 89.8 07/27/2016   PLT 218 07/27/2016     STUDIES: No results found.  ASSESSMENT: Right breast DCIS  PLAN:    1.  Right breast DCIS: Continue tamoxifen for a total of 5 years. She will complete this in March 2020. Most recent mammogram from November 2018 was reported as BI-RADS 2. We'll repeat this in November 2019. Return to clinic in 6 months for routine evaluation. 2.  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 Patient is to continue regular skin exams by her dermatologist. Unfortunately there is no screening for pancreatic cancer. 3. Rectal bleeding and painful bowel movements: Encouraged patient to see her gastroenterologist sooner. She may need a repeat colonoscopy.   Greater than 50% was spent in counseling and coordination of care with this patient including but not limited to discussion of the relevant topics above (See A&P) including, but not limited to diagnosis and management of  acute and chronic medical conditions.   Patient expressed understanding and was in agreement with this plan. She also understands that She can call clinic at any time with any questions, concerns, or complaints.   DCIS (ductal carcinoma in situ)   Staging form: Breast, AJCC 7th Edition     Clinical stage from 02/13/2015: Stage 0 (Tis (DCIS), N0, M0) - Signed by Lloyd Huger, MD on 02/13/2015   Jacquelin Hawking, NP   08/30/2017 10:40 AM

## 2017-09-29 IMAGING — NM NM HEPATO W/GB/PHARM/[PERSON_NAME]
3 series · 18 of 18 positions shown · non-contrast
Comparison: Abdominal ultrasound July 27, 2016

CLINICAL DATA: Two weeks of right upper quadrant abdominal pain
associated with nausea.

EXAM:
NUCLEAR MEDICINE HEPATOBILIARY IMAGING WITH GALLBLADDER EF
TECHNIQUE: Sequential images of the abdomen were obtained [DATE] minutes
following intravenous administration of radiopharmaceutical. After
oral ingestion of Ensure, gallbladder ejection fraction was
determined. At 60 min, normal ejection fraction is greater than 33%.
RADIOPHARMACEUTICALS:  4.9 mCi Fc-NNm  Choletec IV

[Series 1000: hepatobiliary dynamic · 9.59mm/px · 6 of 59 frames shown]
[frame 5/59]
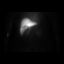
[frame 15/59]
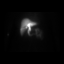
[frame 25/59]
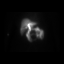
[frame 35/59]
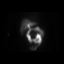
[frame 45/59]
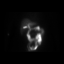
[frame 55/59]
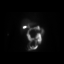

[Series 1000: gallbladder ef dynamic (results) · 4.80mm/px · 6 of 120 frames shown]
[frame 11/120]
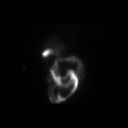
[frame 31/120]
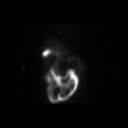
[frame 51/120]
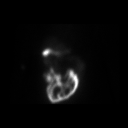
[frame 71/120]
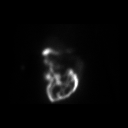
[frame 91/120]
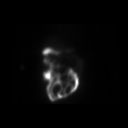
[frame 111/120]
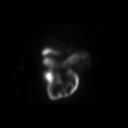

[Series 1000: gallbladder ef dynamic · 4.80mm/px · 6 of 120 frames shown]
[frame 11/120]
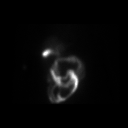
[frame 31/120]
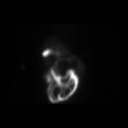
[frame 51/120]
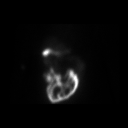
[frame 71/120]
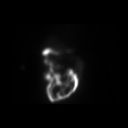
[frame 91/120]
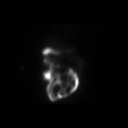
[frame 111/120]
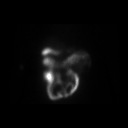

[18 of 18 positions shown; findings below may reference images not displayed]

FINDINGS: Prompt uptake and biliary excretion of activity by the liver is
seen. Gallbladder activity is visualized, consistent with patency of
cystic duct. Biliary activity passes into small bowel, consistent
with patent common bile duct.

Calculated gallbladder ejection fraction is 32%%. (Normal
gallbladder ejection fraction with Ensure is greater than 33%.) the
patient experience abdominal discomfort after Ensure ingestion.
IMPRESSION: Decreased ejection fraction consistent with gallbladder dysfunction.
No evidence of cystic duct or common bile duct obstruction.

## 2017-10-10 ENCOUNTER — Encounter: Payer: Self-pay | Admitting: Radiation Oncology

## 2017-10-10 ENCOUNTER — Other Ambulatory Visit: Payer: Self-pay

## 2017-10-10 ENCOUNTER — Ambulatory Visit
Admission: RE | Admit: 2017-10-10 | Discharge: 2017-10-10 | Disposition: A | Discharge status: Home / Self Care | Payer: BLUE CROSS/BLUE SHIELD | Source: Ambulatory Visit | Attending: Radiation Oncology | Admitting: Radiation Oncology

## 2017-10-10 VITALS — BP 140/84 | HR 94 | Temp 97.9°F | Resp 20 | Wt 170.4 lb

## 2017-10-10 DIAGNOSIS — Z170 Estrogen receptor positive status [ER+]: Secondary | ICD-10-CM | POA: Insufficient documentation

## 2017-10-10 DIAGNOSIS — D0511 Intraductal carcinoma in situ of right breast: Secondary | ICD-10-CM

## 2017-10-10 DIAGNOSIS — Z79810 Long term (current) use of selective estrogen receptor modulators (SERMs): Secondary | ICD-10-CM | POA: Diagnosis not present

## 2017-10-10 NOTE — Progress Notes (Signed)
Radiation Oncology Follow up Note  Name: Carla Hunt   Date:   10/10/2017 MRN:  [id number] DOB: 09/27/1970    This 47 y.o. female presents to the clinic today for 4 year follow-up status post whole breast radiation to her right breast for ER/PR positive ductal carcinoma in situ.  REFERRING PROVIDER: Idelle Crouch, MD  HPI: patient is a 47 year old female now seen out 4 years having completed whole breast radiation to her right breast for ER/PR positive ductal carcinoma in situ. She seen today in routine follow-up and is doing well. She specifically denies breast tenderness cough or bone pain..she mammograms back in November which were BI-RADS 2 benign which I have reviewed.she is currently on tamoxifen tolerating that well without side effect.  COMPLICATIONS OF TREATMENT: none  FOLLOW UP COMPLIANCE: keeps appointments   PHYSICAL EXAM:  BP 140/84   Pulse 94   Temp 97.9 F (36.6 C)   Resp 20   Wt 170 lb 6.7 oz (77.3 kg)   LMP 06/24/2005   BMI 30.19 kg/m  Lungs are clear to A&P cardiac examination essentially unremarkable with regular rate and rhythm. No dominant mass or nodularity is noted in either breast in 2 positions examined. Incision is well-healed. No axillary or supraclavicular adenopathy is appreciated. Cosmetic result is excellent. Well-developed well-nourished patient in NAD. HEENT reveals PERLA, EOMI, discs not visualized.  Oral cavity is clear. No oral mucosal lesions are identified. Neck is clear without evidence of cervical or supraclavicular adenopathy. Lungs are clear to A&P. Cardiac examination is essentially unremarkable with regular rate and rhythm without murmur rub or thrill. Abdomen is benign with no organomegaly or masses noted. Motor sensory and DTR levels are equal and symmetric in the upper and lower extremities. Cranial nerves II through XII are grossly intact. Proprioception is intact. No peripheral adenopathy or edema is identified. No motor or sensory  levels are noted. Crude visual fields are within normal range.  RADIOLOGY RESULTS: mammograms reviewed compatible above-stated findings  PLAN: present time she continues to do well now over 4 years out with no evidence of disease. I'm please were overall progress.she is a rescheduled for follow-up mammograms in November. She continues on tamoxifen without side effect. I will discontinue follow-up care at this time. Would be happy to reevaluate the patient any time should further treatment be indicated.  I would like to take this opportunity to thank you for allowing me to participate in the care of your patient.Noreene Filbert, MD

## 2018-02-25 NOTE — Progress Notes (Deleted)
[place]  Telephone:(336) [phone] Fax:(336) [phone]  ID: Carla Hunt OB: 02-23-1971  MR#: [id number]  YWV#:[id number]  Patient Care Team: Idelle Crouch, MD as PCP - General (Internal Medicine)  CHIEF COMPLAINT:  Right breast DCIS  INTERVAL HISTORY: Patient returns to clinic for routine 6 month evaluation. She continues to tolerate Tamoxifen without significant side effects. She does not complain of hot flashes today.  She has intermittent abdominal pain and will require a cholecystectomy in the near future. She otherwise feels well and is asymptomatic.  She has no neurologic complaints.  She denies any fevers.  She has a good appetite and denies weight loss.  She has no chest pain or shortness of breath.  She denies any nausea, vomiting, constipation, or diarrhea.  She has no urinary complaints.  Patient offers no further specific complaints today.  REVIEW OF SYSTEMS:   Review of Systems  Constitutional: Negative.  Negative for fever, malaise/fatigue and weight loss.  Respiratory: Negative.  Negative for cough and shortness of breath.   Cardiovascular: Negative.  Negative for chest pain and leg swelling.  Gastrointestinal: Positive for abdominal pain.  Genitourinary: Negative.   Musculoskeletal: Negative.   Neurological: Negative.  Negative for sensory change and weakness.  Psychiatric/Behavioral: The patient has insomnia. The patient is not nervous/anxious.     As per HPI. Otherwise, a complete review of systems is negative.  PAST MEDICAL HISTORY: IBS, migraines  PAST SURGICAL HISTORY: endometrial ablation, tonsillectomy, bilateral tubal ligation, total hysterectomy  FAMILY HISTORY: 4 paternal aunts with breast cancer, unclear ages or stage.  Patient also has family members on her maternal side with colon, lymphoma, and ovarian cancer.  Also, anemia, diabetes, hypertension, CVA     ADVANCED DIRECTIVES:    HEALTH MAINTENANCE: Social History    Tobacco Use  . Smoking status: Never Smoker  . Smokeless tobacco: Never Used  Substance Use Topics  . Alcohol use: Yes    Alcohol/week: 1.0 standard drinks    Types: 1 Glasses of wine per week    Comment: occassional  . Drug use: No     Colonoscopy:  PAP:  Bone density:  Lipid panel:  No Known Allergies  Current Outpatient Medications  Medication Sig Dispense Refill  . albuterol (PROAIR HFA) 108 (90 BASE) MCG/ACT inhaler Inhale 2 puffs into the lungs every 6 (six) hours as needed for wheezing or shortness of breath.     . citalopram (CELEXA) 40 MG tablet Take 40 mg by mouth every morning.     Marland Kitchen ibuprofen (ADVIL,MOTRIN) 200 MG tablet Take 800 mg by mouth every 6 (six) hours as needed for headache or mild pain.    . pantoprazole (PROTONIX) 40 MG tablet Take 40 mg by mouth every morning.     . phentermine (ADIPEX-P) 37.5 MG tablet Take 37.5 mg by mouth daily before breakfast.     . tamoxifen (NOLVADEX) 20 MG tablet Take 1 tablet (20 mg total) by mouth daily. 30 tablet 6  . topiramate (TOPAMAX) 50 MG tablet Take 100 mg by mouth every morning.      No current facility-administered medications for this visit.     OBJECTIVE: There were no vitals filed for this visit.   There is no height or weight on file to calculate BMI.    ECOG FS:0 - Asymptomatic  General: Well-developed, well-nourished, no acute distress. Eyes: Pink conjunctiva, anicteric sclera. Breasts: Bilateral breast and axilla without lumps or masses. Lungs: Clear to auscultation bilaterally. Heart: Regular rate  and rhythm. No rubs, murmurs, or gallops. Abdomen: Soft, nontender, nondistended. No organomegaly noted, normoactive bowel sounds. Musculoskeletal: No edema, cyanosis, or clubbing. Neuro: Alert, answering all questions appropriately. Cranial nerves grossly intact. Skin: No rashes or petechiae noted. Psych: Normal affect.   LAB RESULTS:  Lab Results  Component Value Date   NA 140 07/27/2016   K 4.0  07/27/2016   CL 109 07/27/2016   CO2 23 07/27/2016   GLUCOSE 120 (H) 07/27/2016   BUN 15 07/27/2016   CREATININE 0.87 07/27/2016   CALCIUM 9.1 07/27/2016   PROT 7.3 07/27/2016   ALBUMIN 4.0 07/27/2016   AST 20 07/27/2016   ALT 16 07/27/2016   ALKPHOS 74 07/27/2016   BILITOT 0.5 07/27/2016   GFRNONAA >60 07/27/2016   GFRAA >60 07/27/2016    Lab Results  Component Value Date   WBC 10.8 07/27/2016   NEUTROABS 6.0 07/30/2014   HGB 14.3 07/27/2016   HCT 41.2 07/27/2016   MCV 89.8 07/27/2016   PLT 218 07/27/2016     STUDIES: No results found.  ASSESSMENT: Right breast DCIS  PLAN:    1.  Right breast DCIS: Since patient did not have an invasive component, adjuvant chemotherapy was not necessary.  Continue tamoxifen for total 5 years completing in March 2020. Her most recent mammogram on May 22, 2016 was reported as BI-RADS 2, repeat in November 2018. Return to clinic in 6 months for routine evaluation. 2. 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 Unfortunately there is no good screening for pancreatic cancer, but patient should have regular skin exams by a dermatologist.  3. Abdominal pain: Proceed with cholecystectomy as scheduled.  Patient expressed understanding and was in agreement with this plan. She also understands that She can call clinic at any time with any questions, concerns, or complaints.   DCIS (ductal carcinoma in situ)   Staging form: Breast, AJCC 7th Edition     Clinical stage from 02/13/2015: Stage 0 (Tis (DCIS), N0, M0) - Signed by Lloyd Huger, MD on 02/13/2015   Lloyd Huger, MD   02/25/2018 12:18  AM

## 2018-02-28 ENCOUNTER — Inpatient Hospital Stay: Payer: BLUE CROSS/BLUE SHIELD | Admitting: Oncology

## 2018-03-10 NOTE — Progress Notes (Signed)
[name]  Telephone:(336) [phone] Fax:(336) [phone]  ID: Carla Hunt OB: [birth year]  MR#: [id number]  UDJ#:[id number]  Patient Care Team: Idelle Crouch, MD as PCP - General (Internal Medicine)  CHIEF COMPLAINT:  Right breast DCIS  INTERVAL HISTORY: Patient returns to clinic today for routine six-month evaluation.  She continues to tolerate tamoxifen well without significant side effects.  She currently feels well and is asymptomatic.  She has no neurologic complaints.  She denies any recent fevers or illnesses.  She has a good appetite and denies weight loss.  She has no chest pain or shortness of breath.  She denies any nausea, vomiting, constipation, or diarrhea.  She has no urinary complaints.  Patient feels that her baseline offers no specific complaints today.  REVIEW OF SYSTEMS:   Review of Systems  Constitutional: Negative.  Negative for fever, malaise/fatigue and weight loss.  Respiratory: Negative.  Negative for cough and shortness of breath.   Cardiovascular: Negative.  Negative for chest pain and leg swelling.  Gastrointestinal: Negative.  Negative for abdominal pain.  Genitourinary: Negative.  Negative for dysuria.  Musculoskeletal: Negative.  Negative for back pain.  Skin: Negative.  Negative for rash.  Neurological: Negative.  Negative for sensory change, weakness and headaches.  Psychiatric/Behavioral: Negative.  The patient is not nervous/anxious and does not have insomnia.     As per HPI. Otherwise, a complete review of systems is negative.  PAST MEDICAL HISTORY: IBS, migraines  PAST SURGICAL HISTORY: endometrial ablation, tonsillectomy, bilateral tubal ligation, total hysterectomy  FAMILY HISTORY: 4 paternal aunts with breast cancer, unclear ages or stage.  Patient also has family members on her maternal side with colon, lymphoma, and ovarian cancer.  Also, anemia, diabetes, hypertension, CVA     ADVANCED DIRECTIVES:    HEALTH  MAINTENANCE: Social History   Tobacco Use  . Smoking status: Never Smoker  . Smokeless tobacco: Never Used  Substance Use Topics  . Alcohol use: Yes    Alcohol/week: 1.0 standard drinks    Types: 1 Glasses of wine per week    Comment: occassional  . Drug use: No     Colonoscopy:  PAP:  Bone density:  Lipid panel:  No Known Allergies  Current Outpatient Medications  Medication Sig Dispense Refill  . albuterol (PROAIR HFA) 108 (90 BASE) MCG/ACT inhaler Inhale 2 puffs into the lungs every 6 (six) hours as needed for wheezing or shortness of breath.     . citalopram (CELEXA) 40 MG tablet Take 40 mg by mouth every morning.     Marland Kitchen ibuprofen (ADVIL,MOTRIN) 200 MG tablet Take 800 mg by mouth every 6 (six) hours as needed for headache or mild pain.    . pantoprazole (PROTONIX) 40 MG tablet Take 40 mg by mouth every morning.     . phentermine (ADIPEX-P) 37.5 MG tablet Take 37.5 mg by mouth daily before breakfast.     . tamoxifen (NOLVADEX) 20 MG tablet Take 1 tablet (20 mg total) by mouth daily. 30 tablet 6  . topiramate (TOPAMAX) 50 MG tablet Take 100 mg by mouth every morning.      No current facility-administered medications for this visit.     OBJECTIVE: Vitals:   03/14/18 1101 03/14/18 1107  BP:  126/87  Pulse:  (!) 102  Resp: 12   Temp:  97.8 F (36.6 C)     Body mass index is 32.41 kg/m.    ECOG FS:0 - Asymptomatic  General: Well-developed, well-nourished, no acute distress.  Eyes: Pink conjunctiva, anicteric sclera. HEENT: Normocephalic, moist mucous membranes. Breast: Exam deferred today. Lungs: Clear to auscultation bilaterally. Heart: Regular rate and rhythm. No rubs, murmurs, or gallops. Abdomen: Soft, nontender, nondistended. No organomegaly noted, normoactive bowel sounds. Musculoskeletal: No edema, cyanosis, or clubbing. Neuro: Alert, answering all questions appropriately. Cranial nerves grossly intact. Skin: No rashes or petechiae noted. Psych: Normal  affect.  LAB RESULTS:  Lab Results  Component Value Date   NA 140 07/27/2016   K 4.0 07/27/2016   CL 109 07/27/2016   CO2 23 07/27/2016   GLUCOSE 120 (H) 07/27/2016   BUN 15 07/27/2016   CREATININE 0.87 07/27/2016   CALCIUM 9.1 07/27/2016   PROT 7.3 07/27/2016   ALBUMIN 4.0 07/27/2016   AST 20 07/27/2016   ALT 16 07/27/2016   ALKPHOS 74 07/27/2016   BILITOT 0.5 07/27/2016   GFRNONAA >60 07/27/2016   GFRAA >60 07/27/2016    Lab Results  Component Value Date   WBC 10.8 07/27/2016   NEUTROABS 6.0 07/30/2014   HGB 14.3 07/27/2016   HCT 41.2 07/27/2016   MCV 89.8 07/27/2016   PLT 218 07/27/2016     STUDIES: No results found.  ASSESSMENT: Right breast DCIS  PLAN:    1.  Right breast DCIS: Since patient did not have an invasive component, adjuvant chemotherapy was not necessary.  Patient's most recent mammogram on May 23, 2017 was reported as BI-RADS 2.  Repeat in November 2019.  Continue tamoxifen for a total of 5 years completing treatment in March 2020.  Return to clinic in 6 months for routine evaluation.   2. 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 Unfortunately there is no good screening for pancreatic cancer, but patient should have regular skin exams by a dermatologist.   I spent a total of 20 minutes face-to-face with the patient of which greater than 50% of the visit was spent in counseling and coordination of care as detailed above.   Patient expressed understanding and was in agreement with this plan. She also understands that She can call clinic at any time with any questions, concerns, or complaints.   DCIS  (ductal carcinoma in situ)   Staging form: Breast, AJCC 7th Edition     Clinical stage from 02/13/2015: Stage 0 (Tis (DCIS), N0, M0) - Signed by Lloyd Huger, MD on 02/13/2015   Lloyd Huger, MD   03/18/2018 7:20 AM

## 2018-03-14 ENCOUNTER — Encounter: Payer: Self-pay | Admitting: Oncology

## 2018-03-14 ENCOUNTER — Inpatient Hospital Stay: Payer: BLUE CROSS/BLUE SHIELD | Attending: Oncology | Admitting: Oncology

## 2018-03-14 ENCOUNTER — Other Ambulatory Visit: Payer: Self-pay

## 2018-03-14 VITALS — BP 126/87 | HR 102 | Temp 97.8°F | Resp 12 | Ht 62.0 in | Wt 177.2 lb

## 2018-03-14 DIAGNOSIS — Z79810 Long term (current) use of selective estrogen receptor modulators (SERMs): Secondary | ICD-10-CM

## 2018-03-14 DIAGNOSIS — D0511 Intraductal carcinoma in situ of right breast: Secondary | ICD-10-CM | POA: Diagnosis present

## 2018-03-14 DIAGNOSIS — E119 Type 2 diabetes mellitus without complications: Secondary | ICD-10-CM | POA: Insufficient documentation

## 2018-03-14 DIAGNOSIS — I10 Essential (primary) hypertension: Secondary | ICD-10-CM | POA: Insufficient documentation

## 2018-03-14 NOTE — Progress Notes (Signed)
Patient here for follow up. Her last breast exam was November of 2018.

## 2018-05-24 ENCOUNTER — Ambulatory Visit
Admission: RE | Admit: 2018-05-24 | Discharge: 2018-05-24 | Disposition: A | Discharge status: Home / Self Care | Payer: BLUE CROSS/BLUE SHIELD | Source: Ambulatory Visit | Attending: Oncology | Admitting: Oncology

## 2018-05-24 DIAGNOSIS — D0511 Intraductal carcinoma in situ of right breast: Secondary | ICD-10-CM | POA: Insufficient documentation

## 2018-09-16 ENCOUNTER — Inpatient Hospital Stay: Payer: BLUE CROSS/BLUE SHIELD | Admitting: Oncology

## 2018-09-16 NOTE — Progress Notes (Deleted)
[place]  Telephone:(336) [phone] Fax:(336) [phone]  ID: Carla Hunt OB: 10-01-1970  MR#: [id number]  OIB#:[id number]  Patient Care Team: Idelle Crouch, MD as PCP - General (Internal Medicine)  CHIEF COMPLAINT:  Right breast DCIS  INTERVAL HISTORY: Patient returns to clinic today for routine six-month evaluation.  She continues to tolerate tamoxifen well without significant side effects.  She currently feels well and is asymptomatic.  She has no neurologic complaints.  She denies any recent fevers or illnesses.  She has a good appetite and denies weight loss.  She has no chest pain or shortness of breath.  She denies any nausea, vomiting, constipation, or diarrhea.  She has no urinary complaints.  Patient feels that her baseline offers no specific complaints today.  REVIEW OF SYSTEMS:   Review of Systems  Constitutional: Negative.  Negative for fever, malaise/fatigue and weight loss.  Respiratory: Negative.  Negative for cough and shortness of breath.   Cardiovascular: Negative.  Negative for chest pain and leg swelling.  Gastrointestinal: Negative.  Negative for abdominal pain.  Genitourinary: Negative.  Negative for dysuria.  Musculoskeletal: Negative.  Negative for back pain.  Skin: Negative.  Negative for rash.  Neurological: Negative.  Negative for sensory change, weakness and headaches.  Psychiatric/Behavioral: Negative.  The patient is not nervous/anxious and does not have insomnia.     As per HPI. Otherwise, a complete review of systems is negative.  PAST MEDICAL HISTORY: IBS, migraines  PAST SURGICAL HISTORY: endometrial ablation, tonsillectomy, bilateral tubal ligation, total hysterectomy  FAMILY HISTORY: 4 paternal aunts with breast cancer, unclear ages or stage.  Patient also has family members on her maternal side with colon, lymphoma, and ovarian cancer.  Also, anemia, diabetes, hypertension, CVA     ADVANCED DIRECTIVES:    HEALTH  MAINTENANCE: Social History   Tobacco Use  . Smoking status: Never Smoker  . Smokeless tobacco: Never Used  Substance Use Topics  . Alcohol use: Yes    Alcohol/week: 1.0 standard drinks    Types: 1 Glasses of wine per week    Comment: occassional  . Drug use: No     Colonoscopy:  PAP:  Bone density:  Lipid panel:  No Known Allergies  Current Outpatient Medications  Medication Sig Dispense Refill  . albuterol (PROAIR HFA) 108 (90 BASE) MCG/ACT inhaler Inhale 2 puffs into the lungs every 6 (six) hours as needed for wheezing or shortness of breath.     . citalopram (CELEXA) 40 MG tablet Take 40 mg by mouth every morning.     Marland Kitchen ibuprofen (ADVIL,MOTRIN) 200 MG tablet Take 800 mg by mouth every 6 (six) hours as needed for headache or mild pain.    . pantoprazole (PROTONIX) 40 MG tablet Take 40 mg by mouth every morning.     . phentermine (ADIPEX-P) 37.5 MG tablet Take 37.5 mg by mouth daily before breakfast.     . tamoxifen (NOLVADEX) 20 MG tablet Take 1 tablet (20 mg total) by mouth daily. 30 tablet 6  . topiramate (TOPAMAX) 50 MG tablet Take 100 mg by mouth every morning.      No current facility-administered medications for this visit.     OBJECTIVE: There were no vitals filed for this visit.   There is no height or weight on file to calculate BMI.    ECOG FS:0 - Asymptomatic  General: Well-developed, well-nourished, no acute distress. Eyes: Pink conjunctiva, anicteric sclera. HEENT: Normocephalic, moist mucous membranes. Breast: Exam deferred today. Lungs: Clear  to auscultation bilaterally. Heart: Regular rate and rhythm. No rubs, murmurs, or gallops. Abdomen: Soft, nontender, nondistended. No organomegaly noted, normoactive bowel sounds. Musculoskeletal: No edema, cyanosis, or clubbing. Neuro: Alert, answering all questions appropriately. Cranial nerves grossly intact. Skin: No rashes or petechiae noted. Psych: Normal affect.  LAB RESULTS:  Lab Results  Component  Value Date   NA 140 07/27/2016   K 4.0 07/27/2016   CL 109 07/27/2016   CO2 23 07/27/2016   GLUCOSE 120 (H) 07/27/2016   BUN 15 07/27/2016   CREATININE 0.87 07/27/2016   CALCIUM 9.1 07/27/2016   PROT 7.3 07/27/2016   ALBUMIN 4.0 07/27/2016   AST 20 07/27/2016   ALT 16 07/27/2016   ALKPHOS 74 07/27/2016   BILITOT 0.5 07/27/2016   GFRNONAA >60 07/27/2016   GFRAA >60 07/27/2016    Lab Results  Component Value Date   WBC 10.8 07/27/2016   NEUTROABS 6.0 07/30/2014   HGB 14.3 07/27/2016   HCT 41.2 07/27/2016   MCV 89.8 07/27/2016   PLT 218 07/27/2016     STUDIES: No results found.  ASSESSMENT: Right breast DCIS  PLAN:    1.  Right breast DCIS: Since patient did not have an invasive component, adjuvant chemotherapy was not necessary.  Patient's most recent mammogram on May 23, 2017 was reported as BI-RADS 2.  Repeat in November 2019.  Continue tamoxifen for a total of 5 years completing treatment in March 2020.  Return to clinic in 6 months for routine evaluation.   2. 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 Unfortunately there is no good screening for pancreatic cancer, but patient should have regular skin exams by a dermatologist.   I spent a total of 20 minutes face-to-face with the patient of which greater than 50% of the visit was spent in counseling and coordination of care as detailed above.   Patient expressed understanding and was in agreement with this plan. She also understands that She can call clinic at any time with any questions, concerns, or complaints.   DCIS (ductal carcinoma in situ)   Staging form: Breast, AJCC  7th Edition     Clinical stage from 02/13/2015: Stage 0 (Tis (DCIS), N0, M0) - Signed by Lloyd Huger, MD on 02/13/2015   Lloyd Huger, MD   09/16/2018 11:56 AM

## 2019-04-07 ENCOUNTER — Other Ambulatory Visit: Payer: Self-pay | Admitting: Internal Medicine

## 2019-04-07 DIAGNOSIS — Z1231 Encounter for screening mammogram for malignant neoplasm of breast: Secondary | ICD-10-CM
